# Patient Record
Sex: Female | Born: 1990 | Race: White | Hispanic: No | Marital: Single | Smoking: Never smoker
Health system: Southern US, Community
[De-identification: ages and names within clinical notes are randomized; demographics above are authoritative.]

## PROBLEM LIST (undated history)

## (undated) DIAGNOSIS — K219 Gastro-esophageal reflux disease without esophagitis: Secondary | ICD-10-CM

## (undated) DIAGNOSIS — F32A Depression, unspecified: Secondary | ICD-10-CM

## (undated) DIAGNOSIS — M5126 Other intervertebral disc displacement, lumbar region: Secondary | ICD-10-CM

## (undated) DIAGNOSIS — M1990 Unspecified osteoarthritis, unspecified site: Secondary | ICD-10-CM

## (undated) DIAGNOSIS — F419 Anxiety disorder, unspecified: Secondary | ICD-10-CM

## (undated) HISTORY — DX: Depression, unspecified: F32.A

## (undated) HISTORY — DX: Anxiety disorder, unspecified: F41.9

## (undated) HISTORY — DX: Unspecified osteoarthritis, unspecified site: M19.90

## (undated) HISTORY — DX: Gastro-esophageal reflux disease without esophagitis: K21.9

---

## 2017-08-15 ENCOUNTER — Other Ambulatory Visit: Payer: Self-pay

## 2017-08-15 ENCOUNTER — Emergency Department
Admission: EM | Admit: 2017-08-15 | Discharge: 2017-08-15 | Disposition: A | Discharge status: Home / Self Care | Payer: BLUE CROSS/BLUE SHIELD | Attending: Emergency Medicine | Admitting: Emergency Medicine

## 2017-08-15 ENCOUNTER — Encounter: Payer: Self-pay | Admitting: Emergency Medicine

## 2017-08-15 DIAGNOSIS — M5431 Sciatica, right side: Secondary | ICD-10-CM | POA: Insufficient documentation

## 2017-08-15 DIAGNOSIS — M545 Low back pain: Secondary | ICD-10-CM | POA: Diagnosis present

## 2017-08-15 HISTORY — DX: Other intervertebral disc displacement, lumbar region: M51.26

## 2017-08-15 MED ORDER — HYDROCODONE-ACETAMINOPHEN 5-325 MG PO TABS: 1.0000 | ORAL_TABLET | ORAL | 0 refills | Status: DC | PRN

## 2017-08-15 MED ORDER — CYCLOBENZAPRINE HCL 10 MG PO TABS
10.0000 mg | ORAL_TABLET | Freq: Once | ORAL | Status: AC
  Administered 2017-08-15: 10 mg via ORAL
  Filled 2017-08-15: qty 1

## 2017-08-15 MED ORDER — CYCLOBENZAPRINE HCL 10 MG PO TABS: 10.0000 mg | ORAL_TABLET | Freq: Three times a day (TID) | ORAL | 0 refills | Status: DC | PRN

## 2017-08-15 MED ORDER — PREDNISONE 10 MG (21) PO TBPK: ORAL_TABLET | ORAL | 0 refills | Status: DC

## 2017-08-15 NOTE — ED Provider Notes (Signed)
[facility] Emergency Department Provider Note ____________________________________________  Time seen: Approximately 10:35 AM  I have reviewed the triage vital signs and the nursing notes.  HISTORY  Chief Complaint Back Pain   HPI Megan Marsh is a 27 y.o. female who presents to the emergency department for treatment and evaluation of low back pain that started 2 days ago. She states she got on her knees to plug a cord in an outlet  Past Medical History:  Diagnosis Date  . Lumbar herniated disc     There are no active problems to display for this patient.   History reviewed. No pertinent surgical history.  Prior to Admission medications   Medication Sig Start Date End Date Taking? Authorizing Provider  cyclobenzaprine (FLEXERIL) 10 MG tablet Take 1 tablet (10 mg total) by mouth 3 (three) times daily as needed for muscle spasms. 08/15/17   Lilu Mcglown, Rulon Eisenmenger B, FNP  HYDROcodone-acetaminophen (NORCO/VICODIN) 5-325 MG tablet Take 1 tablet by mouth every 4 (four) hours as needed for moderate pain. 08/15/17 08/15/18  Dwayne Bulkley, Kasandra Knudsen, FNP  predniSONE (STERAPRED UNI-PAK 21 TAB) 10 MG (21) TBPK tablet Take 6 tablets on day 1 Take 5 tablets on day 2 Take 4 tablets on day 3 Take 3 tablets on day 4 Take 2 tablets on day 5 Take 1 tablet on day 6 08/15/17   Kem Boroughs B, FNP    Allergies Patient has no known allergies.  History reviewed. No pertinent family history.  Social History Social History   Tobacco Use  . Smoking status: Never Smoker  . Smokeless tobacco: Never Used  Substance Use Topics  . Alcohol use: No    Frequency: Never  . Drug use: No    Review of Systems Constitutional: Well appearing. Respiratory: Negative for dyspnea. Cardiovascular: Negative for change in skin temperature or color. Musculoskeletal:   Negative for chronic steroid use   Negative for trauma in the presence of osteoporosis  Negative for age over 43 and  trauma.  Negative for constitutional symptoms, or history of cancer.  Negative for pain worse at night. Skin:   Negative for rash, lesion, or wound.  Genitourinary: Negative for urinary retention. Rectal: Negative for fecal incontinence or new onset constipation/bowel habit changes. Hematological/Immunilogical: Negative for immunosuppression, IV drug use, or fever Neurological: Positive for burning, tingling, numb, electric, radiating pain in the right lower back with radiation into the posterior leg to knee.                        Negative for saddle anesthesia.                        Negative for focal neurologic deficit, progressive or disabling symptoms             Negative for saddle anesthesia. ____________________________________________   PHYSICAL EXAM:  VITAL SIGNS: ED Triage Vitals  Enc Vitals Group     BP 08/15/17 0751 (!) 160/80     Pulse Rate 08/15/17 0750 94     Resp 08/15/17 0750 18     Temp 08/15/17 0750 97.7 F (36.5 C)     Temp Source 08/15/17 0750 Oral     SpO2 08/15/17 0750 99 %     Weight 08/15/17 0750 290 lb (131.5 kg)     Height 08/15/17 0750 5\' 5"  (1.651 m)     Head Circumference --      Peak Flow --  Pain Score 08/15/17 0750 7     Pain Loc --      Pain Edu? --      Excl. in GC? --     Constitutional: Alert and oriented. Well appearing and in no acute distress. Eyes: Conjunctivae are clear without discharge or drainage.  Head: Atraumatic. Neck: Full, active range of motion. Respiratory: Respirations even and unlabored. Musculoskeletal: Decreased ROM of the back and extremities, Strength 5/5 of the lower extremities as tested. Neurologic: Reflexes of the lower extremities are 2+. Positive straight leg raise on the right side. Skin: Atraumatic.  Psychiatric: Behavior and affect are normal.  ____________________________________________   LABS (all labs ordered are listed, but only abnormal results are displayed)  Labs Reviewed - No data to  display ____________________________________________  RADIOLOGY  Not indicated. ____________________________________________   PROCEDURES  Procedure(s) performed:  Procedures ____________________________________________   INITIAL IMPRESSION / ASSESSMENT AND PLAN / ED COURSE  Megan SimmondsCarolyn Mallinger is a 27 y.o. female Who presents to the emergency department for evaluation and treatment of low back pain that radiates into the right lower extremity that started after she bent over and plugged an electric cord into the wall 2 days ago.  Symptoms and exam are most consistent with sciatica.  She will be given prescriptions for prednisone, Flexeril, and Norco.  She was instructed to follow-up with the primary care provider for choice for symptoms that are not improving over the next few days.  She was also instructed to call and schedule follow-up appointment with orthopedics if possible.  She was instructed to return to the emergency department for symptoms of change or worsen if she is unable to schedule an appointment.  Medications  cyclobenzaprine (FLEXERIL) tablet 10 mg (10 mg Oral Given 08/15/17 0827)    ED Discharge Orders        Ordered    cyclobenzaprine (FLEXERIL) 10 MG tablet  3 times daily PRN     08/15/17 0831    predniSONE (STERAPRED UNI-PAK 21 TAB) 10 MG (21) TBPK tablet     08/15/17 0831    HYDROcodone-acetaminophen (NORCO/VICODIN) 5-325 MG tablet  Every 4 hours PRN     08/15/17 0831       Pertinent labs & imaging results that were available during my care of the patient were reviewed by me and considered in my medical decision making (see chart for details).  _________________________________________   FINAL CLINICAL IMPRESSION(S) / ED DIAGNOSES  Final diagnoses:  Sciatica of right side     If controlled substance prescribed during this visit, 12 month history viewed on the [place] prior to issuing an initial prescription for Schedule II or III opiod.   Chinita Pesterriplett,  Medrith Veillon B, FNP 08/15/17 1231  Governor RooksLord, Rebecca, MD 08/15/17 [phone]

## 2017-08-15 NOTE — ED Triage Notes (Signed)
Here for low back pain that started 2 days ago. Felt pain when was bending down to put a plug in wall and stood up.  Reports herniated disc to lumbar.  ambulatory without difficulty. NAD

## 2017-08-29 ENCOUNTER — Other Ambulatory Visit: Payer: Self-pay

## 2017-08-29 ENCOUNTER — Emergency Department
Admission: EM | Admit: 2017-08-29 | Discharge: 2017-08-29 | Disposition: A | Discharge status: Home / Self Care | Payer: BLUE CROSS/BLUE SHIELD | Attending: Emergency Medicine | Admitting: Emergency Medicine

## 2017-08-29 DIAGNOSIS — R05 Cough: Secondary | ICD-10-CM | POA: Diagnosis present

## 2017-08-29 DIAGNOSIS — B9789 Other viral agents as the cause of diseases classified elsewhere: Secondary | ICD-10-CM | POA: Insufficient documentation

## 2017-08-29 DIAGNOSIS — J069 Acute upper respiratory infection, unspecified: Secondary | ICD-10-CM

## 2017-08-29 MED ORDER — OXYMETAZOLINE HCL 0.05 % NA SOLN: 2.0000 | Freq: Two times a day (BID) | NASAL | 2 refills | Status: AC

## 2017-08-29 MED ORDER — GUAIFENESIN-CODEINE 100-10 MG/5ML PO SYRP: 5.0000 mL | ORAL_SOLUTION | Freq: Three times a day (TID) | ORAL | 0 refills | Status: AC | PRN

## 2017-08-29 MED ORDER — LIDOCAINE VISCOUS 2 % MT SOLN: 10.0000 mL | OROMUCOSAL | 0 refills | Status: DC | PRN

## 2017-08-29 MED ORDER — FLUTICASONE PROPIONATE 50 MCG/ACT NA SUSP: 2.0000 | Freq: Every day | NASAL | 0 refills | Status: DC

## 2017-08-29 NOTE — ED Provider Notes (Signed)
[facility] Emergency Department Provider Note  ____________________________________________  Time seen: Approximately 4:12 PM  I have reviewed the triage vital signs and the nursing notes.   HISTORY  Chief Complaint Otalgia; Cough; and Facial Pain    HPI Megan Marsh is a 27 y.o. female that presents emergency department for evaluation of ear pain nonproductive cough for 2 days., nasal congestion, nonproductive cough for 2 days.  No sick contacts.  No fever, chills, nausea, vomiting, abdominal pain, diarrhea, constipation.  Past Medical History:  Diagnosis Date  . Lumbar herniated disc     There are no active problems to display for this patient.   History reviewed. No pertinent surgical history.  Prior to Admission medications   Medication Sig Start Date End Date Taking? Authorizing Provider  cyclobenzaprine (FLEXERIL) 10 MG tablet Take 1 tablet (10 mg total) by mouth 3 (three) times daily as needed for muscle spasms. 08/15/17   Triplett, Cari B, FNP  fluticasone (FLONASE) 50 MCG/ACT nasal spray Place 2 sprays into both nostrils daily. 08/29/17 08/29/18  Enid DerryWagner, Siri Buege, PA-C  guaiFENesin-codeine (ROBITUSSIN AC) 100-10 MG/5ML syrup Take 5 mLs by mouth 3 (three) times daily as needed for up to 2 days for cough. 08/29/17 08/31/17  Enid DerryWagner, Avalynne Diver, PA-C  HYDROcodone-acetaminophen (NORCO/VICODIN) 5-325 MG tablet Take 1 tablet by mouth every 4 (four) hours as needed for moderate pain. 08/15/17 08/15/18  Triplett, Rulon Eisenmengerari B, FNP  lidocaine (XYLOCAINE) 2 % solution Use as directed 10 mLs in the mouth or throat as needed for mouth pain. 08/29/17   Enid DerryWagner, Baraka Klatt, PA-C  oxymetazoline (AFRIN) 0.05 % nasal spray Place 2 sprays into both nostrils 2 (two) times daily for 3 days. 08/29/17 09/01/17  Enid DerryWagner, Trenda Corliss, PA-C  predniSONE (STERAPRED UNI-PAK 21 TAB) 10 MG (21) TBPK tablet Take 6 tablets on day 1 Take 5 tablets on day 2 Take 4 tablets on day 3 Take 3 tablets on day  4 Take 2 tablets on day 5 Take 1 tablet on day 6 08/15/17   Kem Boroughsriplett, Cari B, FNP    Allergies Patient has no known allergies.  No family history on file.  Social History Social History   Tobacco Use  . Smoking status: Never Smoker  . Smokeless tobacco: Never Used  Substance Use Topics  . Alcohol use: No    Frequency: Never  . Drug use: No     Review of Systems  Constitutional: No fever/chills Eyes: No visual changes. No discharge. ENT: Positive for congestion and rhinorrhea. Cardiovascular: No chest pain. Respiratory: Positive for cough. No SOB. Gastrointestinal: No abdominal pain.  No nausea, no vomiting.  No diarrhea.  No constipation. Musculoskeletal: Negative for musculoskeletal pain. Skin: Negative for rash, abrasions, lacerations, ecchymosis. Neurological: Negative for headaches.   ____________________________________________   PHYSICAL EXAM:  VITAL SIGNS: ED Triage Vitals [08/29/17 1455]  Enc Vitals Group     BP (!) 152/107     Pulse Rate (!) 112     Resp 16     Temp 97.7 F (36.5 C)     Temp Source Oral     SpO2 96 %     Weight 290 lb (131.5 kg)     Height 5\' 5"  (1.651 m)     Head Circumference      Peak Flow      Pain Score 6     Pain Loc      Pain Edu?      Excl. in GC?      Constitutional: Alert  and oriented. Well appearing and in no acute distress. Eyes: Conjunctivae are normal. PERRL. EOMI. No discharge. Head: Atraumatic. ENT: No frontal and maxillary sinus tenderness.      Ears: Tympanic membranes pearly gray with good landmarks. No discharge.      Nose: Mild congestion/rhinnorhea.      Mouth/Throat: Mucous membranes are moist. Oropharynx non-erythematous. Tonsils not enlarged. No exudates. Uvula midline. Neck: No stridor.   Hematological/Lymphatic/Immunilogical: No cervical lymphadenopathy. Cardiovascular: Normal rate, regular rhythm.  Good peripheral circulation. Respiratory: Normal respiratory effort without tachypnea or  retractions. Lungs CTAB. Good air entry to the bases with no decreased or absent breath sounds. Gastrointestinal: Bowel sounds 4 quadrants. Soft and nontender to palpation. No guarding or rigidity. No palpable masses. No distention. Musculoskeletal: Full range of motion to all extremities. No gross deformities appreciated. Neurologic:  Normal speech and language. No gross focal neurologic deficits are appreciated.  Skin:  Skin is warm, dry and intact. No rash noted. Psychiatric: Mood and affect are normal. Speech and behavior are normal. Patient exhibits appropriate insight and judgement.   ____________________________________________   LABS (all labs ordered are listed, but only abnormal results are displayed)  Labs Reviewed - No data to display ____________________________________________  EKG   ____________________________________________  RADIOLOGY   No results found.  ____________________________________________    PROCEDURES  Procedure(s) performed:    Procedures    Medications - No data to display   ____________________________________________   INITIAL IMPRESSION / ASSESSMENT AND PLAN / ED COURSE  Pertinent labs & imaging results that were available during my care of the patient were reviewed by me and considered in my medical decision making (see chart for details).  Review of the [place] CSRS was performed in accordance of the [facility] prior to dispensing any controlled drugs.     Patient's diagnosis is consistent with viral URI. Vital signs and exam are reassuring. Patient appears well and is staying well hydrated. Patient should alternate tylenol and ibuprofen for fever. Patient feels comfortable going home. Patient will be discharged home with prescriptions for Flonase, Afrin, Robitussin, viscous lidocaine. Patient is to follow up with PCP as needed or otherwise directed. Patient is given ED precautions to return to the ED for any worsening or new  symptoms.     ____________________________________________  FINAL CLINICAL IMPRESSION(S) / ED DIAGNOSES  Final diagnoses:  Viral URI with cough      NEW MEDICATIONS STARTED DURING THIS VISIT:  ED Discharge Orders        Ordered    fluticasone (FLONASE) 50 MCG/ACT nasal spray  Daily     08/29/17 1648    oxymetazoline (AFRIN) 0.05 % nasal spray  2 times daily     08/29/17 1648    guaiFENesin-codeine (ROBITUSSIN AC) 100-10 MG/5ML syrup  3 times daily PRN     08/29/17 1648    lidocaine (XYLOCAINE) 2 % solution  As needed     08/29/17 1648          This chart was dictated using voice recognition software/Dragon. Despite best efforts to proofread, errors can occur which can change the meaning. Any change was purely unintentional.    Enid Derry, PA-C 08/29/17 2249    Don Perking, [place], MD 09/02/17 1539

## 2017-08-29 NOTE — ED Triage Notes (Signed)
Cough, nasal congestion, ear pain X 2 days. Pt alert and oriented X4, active, cooperative, pt in NAD. RR even and unlabored, color WNL.

## 2018-06-24 ENCOUNTER — Encounter: Payer: Self-pay | Admitting: Emergency Medicine

## 2018-06-24 ENCOUNTER — Other Ambulatory Visit: Payer: Self-pay

## 2018-06-24 ENCOUNTER — Emergency Department
Admission: EM | Admit: 2018-06-24 | Discharge: 2018-06-24 | Disposition: A | Discharge status: Home / Self Care | Payer: BLUE CROSS/BLUE SHIELD | Attending: Emergency Medicine | Admitting: Emergency Medicine

## 2018-06-24 DIAGNOSIS — R51 Headache: Secondary | ICD-10-CM

## 2018-06-24 DIAGNOSIS — Z79899 Other long term (current) drug therapy: Secondary | ICD-10-CM | POA: Insufficient documentation

## 2018-06-24 DIAGNOSIS — R519 Headache, unspecified: Secondary | ICD-10-CM

## 2018-06-24 DIAGNOSIS — J0100 Acute maxillary sinusitis, unspecified: Secondary | ICD-10-CM | POA: Insufficient documentation

## 2018-06-24 DIAGNOSIS — R030 Elevated blood-pressure reading, without diagnosis of hypertension: Secondary | ICD-10-CM

## 2018-06-24 LAB — INFLUENZA PANEL BY PCR (TYPE A & B)
Influenza A By PCR: NEGATIVE
Influenza B By PCR: NEGATIVE

## 2018-06-24 MED ORDER — AMOXICILLIN-POT CLAVULANATE 875-125 MG PO TABS: 1.0000 | ORAL_TABLET | Freq: Two times a day (BID) | ORAL | 0 refills | Status: AC

## 2018-06-24 MED ORDER — ACETAMINOPHEN 500 MG PO TABS: 500.0000 mg | ORAL_TABLET | Freq: Four times a day (QID) | ORAL | 0 refills | Status: DC | PRN

## 2018-06-24 NOTE — ED Provider Notes (Signed)
[facility] Emergency Department Provider Note  ____________________________________________  Time seen: Approximately 10:42 AM  I have reviewed the triage vital signs and the nursing notes.   HISTORY  Chief Complaint Facial Pain    HPI Megan Marsh is a 28 y.o. female presents emergency department for evaluation of cheek pain and ear pain and pressure for 3 days.  She states that yesterday the pain was making the roof of her mouth hurt.  Patient states that she had a URI over Christmas and has been on and off since then.  She has some nasal congestion but not a significant amount until she got to the emergency department.  Her nose has been running since in the emergency department.  She had a sinus infection last year.  No dental pain.  This does not feel like a headache.  She does not have a history of high blood pressure but states that she is planning a wedding and has been more stressed than usual recently.  No headache, shortness of breath, chest pain.   Past Medical History:  Diagnosis Date  . Lumbar herniated disc     There are no active problems to display for this patient.   No past surgical history on file.  Prior to Admission medications   Medication Sig Start Date End Date Taking? Authorizing Provider  acetaminophen (TYLENOL) 500 MG tablet Take 1 tablet (500 mg total) by mouth every 6 (six) hours as needed. 06/24/18   Enid Derry, PA-C  amoxicillin-clavulanate (AUGMENTIN) 875-125 MG tablet Take 1 tablet by mouth 2 (two) times daily for 10 days. 06/24/18 07/04/18  Enid Derry, PA-C  cyclobenzaprine (FLEXERIL) 10 MG tablet Take 1 tablet (10 mg total) by mouth 3 (three) times daily as needed for muscle spasms. 08/15/17   Triplett, Cari B, FNP  fluticasone (FLONASE) 50 MCG/ACT nasal spray Place 2 sprays into both nostrils daily. 08/29/17 08/29/18  Enid Derry, PA-C  HYDROcodone-acetaminophen (NORCO/VICODIN) 5-325 MG tablet Take 1 tablet by  mouth every 4 (four) hours as needed for moderate pain. 08/15/17 08/15/18  Triplett, Rulon Eisenmenger B, FNP  lidocaine (XYLOCAINE) 2 % solution Use as directed 10 mLs in the mouth or throat as needed for mouth pain. 08/29/17   Enid Derry, PA-C  predniSONE (STERAPRED UNI-PAK 21 TAB) 10 MG (21) TBPK tablet Take 6 tablets on day 1 Take 5 tablets on day 2 Take 4 tablets on day 3 Take 3 tablets on day 4 Take 2 tablets on day 5 Take 1 tablet on day 6 08/15/17   Kem Boroughs B, FNP    Allergies Patient has no known allergies.  No family history on file.  Social History Social History   Tobacco Use  . Smoking status: Never Smoker  . Smokeless tobacco: Never Used  Substance Use Topics  . Alcohol use: No    Frequency: Never  . Drug use: No     Review of Systems  Constitutional: No fever/chills Eyes: No visual changes. No discharge. ENT: Positive for congestion and rhinorrhea. Cardiovascular: No chest pain. Respiratory: Positive for cough. No SOB. Gastrointestinal: No abdominal pain.  No nausea, no vomiting.   Musculoskeletal: Negative for musculoskeletal pain. Skin: Negative for rash, abrasions, lacerations, ecchymosis. Neurological: Negative for headaches.   ____________________________________________   PHYSICAL EXAM:  VITAL SIGNS: ED Triage Vitals  Enc Vitals Group     BP 06/24/18 1013 (!) 174/98     Pulse Rate 06/24/18 1013 98     Resp 06/24/18 1013 16  Temp 06/24/18 1013 98.1 F (36.7 C)     Temp Source 06/24/18 1013 Oral     SpO2 06/24/18 1013 99 %     Weight 06/24/18 1012 280 lb (127 kg)     Height 06/24/18 1012 5\' 5"  (1.651 m)     Head Circumference --      Peak Flow --      Pain Score 06/24/18 1012 0     Pain Loc --      Pain Edu? --      Excl. in GC? --      Constitutional: Alert and oriented. Well appearing and in no acute distress. Eyes: Conjunctivae are normal. PERRL. EOMI. No discharge. Head: Atraumatic. ENT: Maxillary sinus tenderness.      Ears:  Tympanic membranes pearly gray with good landmarks. No discharge.      Nose: Mild congestion/rhinnorhea.      Mouth/Throat: Mucous membranes are moist. Oropharynx non-erythematous. Tonsils not enlarged. No exudates. Uvula midline. Neck: No stridor.   Hematological/Lymphatic/Immunilogical: No cervical lymphadenopathy. Cardiovascular: Normal rate, regular rhythm.  Good peripheral circulation. Respiratory: Normal respiratory effort without tachypnea or retractions. Lungs CTAB. Good air entry to the bases with no decreased or absent breath sounds. Gastrointestinal: Bowel sounds 4 quadrants. Soft and nontender to palpation. No guarding or rigidity. No palpable masses. No distention. Musculoskeletal: Full range of motion to all extremities. No gross deformities appreciated. Neurologic:  Normal speech and language. No gross focal neurologic deficits are appreciated.  Skin:  Skin is warm, dry and intact. No rash noted. Psychiatric: Mood and affect are normal. Speech and behavior are normal. Patient exhibits appropriate insight and judgement.   ____________________________________________   LABS (all labs ordered are listed, but only abnormal results are displayed)  Labs Reviewed  INFLUENZA PANEL BY PCR (TYPE A & B)   ____________________________________________  EKG   ____________________________________________  RADIOLOGY  No results found.  ____________________________________________    PROCEDURES  Procedure(s) performed:    Procedures    Medications - No data to display   ____________________________________________   INITIAL IMPRESSION / ASSESSMENT AND PLAN / ED COURSE  Pertinent labs & imaging results that were available during my care of the patient were reviewed by me and considered in my medical decision making (see chart for details).  Review of the [place] CSRS was performed in accordance of the [facility] prior to dispensing any controlled drugs.     Patient's  diagnosis is consistent with sinusitis Vital signs and exam are reassuring.  Patient's blood pressure has been elevated similarly in previous visits dating back to 2013.  Patient was highly encouraged to follow-up with primary care for hypertension work-up and medication.  Patient is agreeable to follow-up with primary care. Patient feels comfortable going home. Patient will be discharged home with prescriptions for Augmentin.  Patient is to follow up with PCP as needed or otherwise directed. Patient is given ED precautions to return to the ED for any worsening or new symptoms.     ____________________________________________  FINAL CLINICAL IMPRESSION(S) / ED DIAGNOSES  Final diagnoses:  Elevated blood pressure reading  Facial pain  Acute non-recurrent maxillary sinusitis      NEW MEDICATIONS STARTED DURING THIS VISIT:  ED Discharge Orders         Ordered    amoxicillin-clavulanate (AUGMENTIN) 875-125 MG tablet  2 times daily     06/24/18 1159    acetaminophen (TYLENOL) 500 MG tablet  Every 6 hours PRN     06/24/18 1159  This chart was dictated using voice recognition software/Dragon. Despite best efforts to proofread, errors can occur which can change the meaning. Any change was purely unintentional.    Enid DerryWagner, Rexanne Inocencio, PA-C 06/24/18 1820    Emily FilbertWilliams, Jonathan E, MD 06/25/18 [phone]

## 2018-06-24 NOTE — ED Triage Notes (Signed)
Pt c/o sinus congestion and pain intermittently for the past month. States " I think I have a sinus infection"

## 2018-06-24 NOTE — ED Notes (Signed)
See triage note  Presents with a 1 month hx of sinus pressure  States sxs' are worse over the past 2-3 days  Denies any fever

## 2018-06-24 NOTE — Discharge Instructions (Signed)
You were given an antibiotic for a sinus infection.  Your flu test was negative.  Your blood pressure was elevated in the emergency department, which can be affected by stress and pain.  Please call primary care this week for blood pressure recheck and discussion of hypertension.  High blood pressure puts you at increased risk for a stroke or heart attack.  Return to the emergency department immediately for headache, shortness of breath, chest pain.

## 2018-07-10 ENCOUNTER — Emergency Department
Admission: EM | Admit: 2018-07-10 | Discharge: 2018-07-10 | Disposition: A | Discharge status: Home / Self Care | Payer: BLUE CROSS/BLUE SHIELD | Attending: Emergency Medicine | Admitting: Emergency Medicine

## 2018-07-10 ENCOUNTER — Other Ambulatory Visit: Payer: Self-pay

## 2018-07-10 DIAGNOSIS — B9789 Other viral agents as the cause of diseases classified elsewhere: Secondary | ICD-10-CM | POA: Insufficient documentation

## 2018-07-10 DIAGNOSIS — R05 Cough: Secondary | ICD-10-CM | POA: Diagnosis present

## 2018-07-10 DIAGNOSIS — J069 Acute upper respiratory infection, unspecified: Secondary | ICD-10-CM | POA: Diagnosis not present

## 2018-07-10 DIAGNOSIS — Z79899 Other long term (current) drug therapy: Secondary | ICD-10-CM | POA: Insufficient documentation

## 2018-07-10 LAB — GROUP A STREP BY PCR: Group A Strep by PCR: NOT DETECTED

## 2018-07-10 MED ORDER — ALBUTEROL SULFATE HFA 108 (90 BASE) MCG/ACT IN AERS: 2.0000 | INHALATION_SPRAY | Freq: Four times a day (QID) | RESPIRATORY_TRACT | 0 refills | Status: DC | PRN

## 2018-07-10 MED ORDER — IPRATROPIUM-ALBUTEROL 0.5-2.5 (3) MG/3ML IN SOLN
3.0000 mL | Freq: Once | RESPIRATORY_TRACT | Status: AC
  Administered 2018-07-10: 3 mL via RESPIRATORY_TRACT
  Filled 2018-07-10: qty 3

## 2018-07-10 MED ORDER — BENZONATATE 100 MG PO CAPS: ORAL_CAPSULE | ORAL | 0 refills | Status: DC

## 2018-07-10 MED ORDER — AZITHROMYCIN 250 MG PO TABS: ORAL_TABLET | ORAL | 0 refills | Status: DC

## 2018-07-10 NOTE — ED Triage Notes (Addendum)
Pt comes via POV from home with c/o congestion. Pt states cough nonproductive and dry. Pt also c/o sore throat.  Pt states she was recently prescribed amoxicillin. Pt states she took it all and is still not better.

## 2018-07-10 NOTE — ED Notes (Signed)
Reference triage note. Pt in NAD at this time.  

## 2018-07-10 NOTE — Discharge Instructions (Signed)
Your strep test was negative. Your symptoms are consistent with a likely viral cause. Take the prescription meds as directed. Take OTC Delsym for additional cough relief. Take Tylenol for headaches and fevers. You may consider a daily allergy medicine and steroid nasal spray.

## 2018-07-10 NOTE — ED Provider Notes (Signed)
[facility] Emergency Department Provider Note ____________________________________________  Time seen: 2010  I have reviewed the triage vital signs and the nursing notes.  HISTORY  Chief Complaint  Sore Throat and Cough  HPI Quincy SimmondsCarolyn Dever is a 28 y.o. female presents herself to the ED for evaluation of cough and congestion.  Patient describes cough is nonproductive and dry.  She also notes some sore throat symptoms at this time.  She is about 5 to 6 days out from a 10-day course of amoxicillin, prescribed for presumed sinusitis.  According to the patient, she did not significantly feel improved after the amoxicillin, she presents now noting the symptoms feel different.  She denies any frank fevers but has noted some chills.  She also denies any nausea vomiting, but does experience some loose stools.  She denies any sick contacts, recent travel, or other exposures.  The patient did not receive the seasonal flu vaccine.  Past Medical History:  Diagnosis Date  . Lumbar herniated disc     There are no active problems to display for this patient.   History reviewed. No pertinent surgical history.  Prior to Admission medications   Medication Sig Start Date End Date Taking? Authorizing Provider  acetaminophen (TYLENOL) 500 MG tablet Take 1 tablet (500 mg total) by mouth every 6 (six) hours as needed. 06/24/18   Enid DerryWagner, Ashley, PA-C  albuterol (PROVENTIL HFA;VENTOLIN HFA) 108 (90 Base) MCG/ACT inhaler Inhale 2 puffs into the lungs every 6 (six) hours as needed for wheezing or shortness of breath. 07/10/18   Jvion Turgeon, Charlesetta IvoryJenise V Bacon, PA-C  azithromycin (ZITHROMAX Z-PAK) 250 MG tablet Take 2 tablets (500 mg) on  Day 1,  followed by 1 tablet (250 mg) once daily on Days 2 through 5. 07/10/18   Anali Cabanilla, Charlesetta IvoryJenise V Bacon, PA-C  benzonatate (TESSALON PERLES) 100 MG capsule Take 1-2 tabs TID prn cough 07/10/18   Josefina Rynders, Charlesetta IvoryJenise V Bacon, PA-C  cyclobenzaprine (FLEXERIL) 10 MG tablet Take  1 tablet (10 mg total) by mouth 3 (three) times daily as needed for muscle spasms. 08/15/17   Triplett, Cari B, FNP  fluticasone (FLONASE) 50 MCG/ACT nasal spray Place 2 sprays into both nostrils daily. 08/29/17 08/29/18  Enid DerryWagner, Ashley, PA-C  HYDROcodone-acetaminophen (NORCO/VICODIN) 5-325 MG tablet Take 1 tablet by mouth every 4 (four) hours as needed for moderate pain. 08/15/17 08/15/18  Triplett, Rulon Eisenmengerari B, FNP  lidocaine (XYLOCAINE) 2 % solution Use as directed 10 mLs in the mouth or throat as needed for mouth pain. 08/29/17   Enid DerryWagner, Ashley, PA-C  predniSONE (STERAPRED UNI-PAK 21 TAB) 10 MG (21) TBPK tablet Take 6 tablets on day 1 Take 5 tablets on day 2 Take 4 tablets on day 3 Take 3 tablets on day 4 Take 2 tablets on day 5 Take 1 tablet on day 6 08/15/17   Kem Boroughsriplett, Cari B, FNP    Allergies Patient has no known allergies.  No family history on file.  Social History Social History   Tobacco Use  . Smoking status: Never Smoker  . Smokeless tobacco: Never Used  Substance Use Topics  . Alcohol use: No    Frequency: Never  . Drug use: No    Review of Systems  Constitutional: Negative for fever. Eyes: Negative for visual changes. ENT: Positive for sore throat. Cardiovascular: Negative for chest pain. Respiratory: Negative for shortness of breath.  Reports dry cough. Gastrointestinal: Negative for abdominal pain, vomiting and diarrhea. Genitourinary: Negative for dysuria. Musculoskeletal: Negative for back pain. Skin: Negative  for rash. Neurological: Negative for headaches, focal weakness or numbness. ____________________________________________  PHYSICAL EXAM:  VITAL SIGNS: ED Triage Vitals  Enc Vitals Group     BP 07/10/18 1818 (!) 149/113     Pulse Rate 07/10/18 1818 93     Resp 07/10/18 1818 18     Temp 07/10/18 1818 98.2 F (36.8 C)     Temp Source 07/10/18 1818 Oral     SpO2 07/10/18 1818 99 %     Weight 07/10/18 1819 280 lb (127 kg)     Height 07/10/18 1819 5\' 5"   (1.651 m)     Head Circumference --      Peak Flow --      Pain Score 07/10/18 1819 4     Pain Loc --      Pain Edu? --      Excl. in GC? --     Constitutional: Alert and oriented. Well appearing and in no distress. Head: Normocephalic and atraumatic. Eyes: Conjunctivae are normal. PERRL. Normal extraocular movements Ears: Canals clear. TMs intact bilaterally. Nose: No congestion/rhinorrhea/epistaxis. Mouth/Throat: Mucous membranes are moist. Uvula is midline and tonsils are flat and without exudate. Oropharyngeal erythema noted.  Neck: Supple. No thyromegaly. Hematological/Lymphatic/Immunological: No cervical lymphadenopathy. Cardiovascular: Normal rate, regular rhythm. Normal distal pulses. Respiratory: Normal respiratory effort. No wheezes/rales.  Mild rhonchi noted bilaterally ____________________________________________   LABS (pertinent positives/negatives) Labs Reviewed  GROUP A STREP BY PCR  ____________________________________________  PROCEDURES  Procedures DuoNeb x 1 ____________________________________________  INITIAL IMPRESSION / ASSESSMENT AND PLAN / ED COURSE  Patient with ED evaluation of cough and sore throat. Her strep PCR is negative.  She will be treated empirically for bronchitis with prescriptions for albuterol, azithromycin, Tessalon Perles.  She will follow-up with her provider as needed.  ____________________________________________  FINAL CLINICAL IMPRESSION(S) / ED DIAGNOSES  Final diagnoses:  Viral URI with cough      Camdin Hegner, Charlesetta Ivory, PA-C 07/11/18 0000    Arnaldo Natal, MD 07/16/18 [phone]

## 2018-08-02 ENCOUNTER — Emergency Department: Payer: BLUE CROSS/BLUE SHIELD

## 2018-08-02 ENCOUNTER — Encounter: Payer: Self-pay | Admitting: Emergency Medicine

## 2018-08-02 ENCOUNTER — Emergency Department
Admission: EM | Admit: 2018-08-02 | Discharge: 2018-08-02 | Disposition: A | Discharge status: Home / Self Care | Payer: BLUE CROSS/BLUE SHIELD | Attending: Emergency Medicine | Admitting: Emergency Medicine

## 2018-08-02 ENCOUNTER — Other Ambulatory Visit: Payer: Self-pay

## 2018-08-02 DIAGNOSIS — Y999 Unspecified external cause status: Secondary | ICD-10-CM | POA: Insufficient documentation

## 2018-08-02 DIAGNOSIS — Y92012 Bathroom of single-family (private) house as the place of occurrence of the external cause: Secondary | ICD-10-CM | POA: Insufficient documentation

## 2018-08-02 DIAGNOSIS — M542 Cervicalgia: Secondary | ICD-10-CM | POA: Insufficient documentation

## 2018-08-02 DIAGNOSIS — S0003XA Contusion of scalp, initial encounter: Secondary | ICD-10-CM | POA: Insufficient documentation

## 2018-08-02 DIAGNOSIS — W01198A Fall on same level from slipping, tripping and stumbling with subsequent striking against other object, initial encounter: Secondary | ICD-10-CM | POA: Insufficient documentation

## 2018-08-02 DIAGNOSIS — Y9389 Activity, other specified: Secondary | ICD-10-CM | POA: Insufficient documentation

## 2018-08-02 DIAGNOSIS — S0990XA Unspecified injury of head, initial encounter: Secondary | ICD-10-CM | POA: Diagnosis present

## 2018-08-02 LAB — POCT PREGNANCY, URINE: PREG TEST UR: NEGATIVE

## 2018-08-02 MED ORDER — TRAMADOL HCL 50 MG PO TABS
50.0000 mg | ORAL_TABLET | Freq: Once | ORAL | Status: AC
  Administered 2018-08-02: 50 mg via ORAL
  Filled 2018-08-02: qty 1

## 2018-08-02 NOTE — ED Provider Notes (Signed)
[facility] Emergency Department Provider Note ____________________________________________  Time seen: 1837  I have reviewed the triage vital signs and the nursing notes.  HISTORY  Chief Complaint  Head Injury   HPI Megan Marsh is a 28 y.o. female presents to the ER today with complaint of head injury.  She reports she woke up at 2 AM to use the bathroom.  While in the bathroom she started vomiting.  She somehow hit her forehead on the toilet.  She describes the headache as throbbing.  The pain radiates into her neck.  She describes neck pain as achy.  She denies dizziness, visual changes or confusion.  She has not had any further vomiting.  She reports bruising and swelling to her forehead.  She has applied ice but has not taken any medication over-the-counter.  She is not on any blood thinners.  Past Medical History:  Diagnosis Date  . Lumbar herniated disc     There are no active problems to display for this patient.   History reviewed. No pertinent surgical history.  Prior to Admission medications   Medication Sig Start Date End Date Taking? Authorizing Provider  acetaminophen (TYLENOL) 500 MG tablet Take 1 tablet (500 mg total) by mouth every 6 (six) hours as needed. 06/24/18   Enid Derry, PA-C  albuterol (PROVENTIL HFA;VENTOLIN HFA) 108 (90 Base) MCG/ACT inhaler Inhale 2 puffs into the lungs every 6 (six) hours as needed for wheezing or shortness of breath. 07/10/18   Menshew, Charlesetta Ivory, PA-C    Allergies Patient has no known allergies.  No family history on file.  Social History Social History   Tobacco Use  . Smoking status: Never Smoker  . Smokeless tobacco: Never Used  Substance Use Topics  . Alcohol use: No    Frequency: Never  . Drug use: No    Review of Systems  Constitutional: Negative for fever. Eyes: Negative for visual changes. ENT: Negative for drainage from the ears or throat.. Cardiovascular: Negative for chest  pain or chest tightness.Marland Kitchen Respiratory: Negative for shortness of breath. Gastrointestinal: Positive for 1 episode of vomiting.  Negative for abdominal pain, and diarrhea. Musculoskeletal: Positive for neck pain.  Negative for back pain. Skin: Positive for bruising and swelling to forehead.. Neurological: Positive for headache.  Negative for dizziness, focal weakness or numbness. ____________________________________________  PHYSICAL EXAM:  VITAL SIGNS: ED Triage Vitals [08/02/18 1824]  Enc Vitals Group     BP      Pulse      Resp      Temp      Temp src      SpO2      Weight 279 lb 15.8 oz (127 kg)     Height 5\' 5"  (1.651 m)     Head Circumference      Peak Flow      Pain Score 8     Pain Loc      Pain Edu?      Excl. in GC?     Constitutional: Alert and oriented. Well appearing and in no distress. Head: Normocephalic, traumatic hematoma noted to the central forehead. Eyes: Conjunctivae are normal. PERRL. Normal extraocular movements Ears: Canals clear. TMs intact bilaterally.  No drainage noted Nose: No congestion/rhinorrhea/epistaxis.  No drainage noted Cardiovascular: Normal rate, regular rhythm.  Respiratory: Normal respiratory effort. No wheezes/rales/rhonchi. Musculoskeletal: Normal flexion and rotation of the cervical spine.  Pain with extension of the cervical spine.  Bony tenderness noted over the cervical spine.  Neurologic:  Normal gait without ataxia. Normal speech and language. No gross focal neurologic deficits are appreciated. Skin:  Skin is warm, dry and intact.  Traumatic hematoma noted to the central forehead.  ____________________________________________     RADIOLOGY  Imaging Orders     CT Head Wo Contrast     CT Cervical Spine Wo Contrast IMPRESSION:  1. No evidence of acute intracranial abnormality.  2. Mild frontal scalp swelling.  3. No acute cervical spine fracture identified with suboptimal  assessment from C6-T1.  4. Mild upper cervical  lymphadenopathy, nonspecific but potentially  reactive. Clinical follow-up recommended with consideration for  follow-up neck CT if lymphadenopathy persists.    ____________________________________________    INITIAL IMPRESSION / ASSESSMENT AND PLAN / ED COURSE  Acute Headache, Acute Neck Pain s/p Head Injury:  CT Head with mild frontal scalp swelling CT Cervical Spine negative for acute fracture Tramadol 50 mg PO x 1 given in ER Advised ice for 20 minutes 2 x day Advised Tylenol 1000 mg every 8 hours as needed for pain     I reviewed the patient's prescription history over the last 12 months in the multi-state controlled substances database(s) that includes [place], [place], [place], [place], [place], [place], [place], [place], [place], [place], [place], [place], [place], and [place].  Results were notable for no controlled substance in the last 6 months. ____________________________________________  FINAL CLINICAL IMPRESSION(S) / ED DIAGNOSES  Final diagnoses:  Minor head injury, initial encounter  Contusion of scalp, initial encounter  Acute neck pain   Nicki Reaper, NP    Lorre Munroe, NP 08/02/18 2046    Dionne Bucy, MD 08/02/18 2322

## 2018-08-02 NOTE — ED Notes (Signed)
Pt states that she was in bathroom when she "woke up on the bathroom floor" and she hit her forehead on bathroom floor. Pt has no clue how long she was blacked out for.

## 2018-08-02 NOTE — ED Triage Notes (Signed)
First Nurse Note:  Arrives for evaluation of head injury. States slipped and hit head on the tub this morning.  ? LOC.  C/O persistent headache.  naprosyn taken at 1200 with minimal relief.  AAOx3.  Skin warm and dry.  NAD

## 2018-08-02 NOTE — Discharge Instructions (Addendum)
You were seen today for acute headache, acute neck pain status post head injury.  The CT scan of your head and neck is negative for any acute findings.  Please apply ice for 20 minutes twice daily to help improve pain and swelling.  You can also take Tylenol 1000 milligrams every 8 hours as needed for pain.

## 2019-07-08 ENCOUNTER — Encounter: Payer: Self-pay | Admitting: Emergency Medicine

## 2019-07-08 ENCOUNTER — Other Ambulatory Visit: Payer: Self-pay

## 2019-07-08 ENCOUNTER — Emergency Department: Payer: Self-pay

## 2019-07-08 ENCOUNTER — Emergency Department
Admission: EM | Admit: 2019-07-08 | Discharge: 2019-07-08 | Disposition: A | Discharge status: Home / Self Care | Payer: Self-pay | Attending: Emergency Medicine | Admitting: Emergency Medicine

## 2019-07-08 DIAGNOSIS — N390 Urinary tract infection, site not specified: Secondary | ICD-10-CM | POA: Insufficient documentation

## 2019-07-08 DIAGNOSIS — R42 Dizziness and giddiness: Secondary | ICD-10-CM | POA: Insufficient documentation

## 2019-07-08 LAB — URINALYSIS, COMPLETE (UACMP) WITH MICROSCOPIC
Bacteria, UA: NONE SEEN
Bilirubin Urine: NEGATIVE
Glucose, UA: NEGATIVE mg/dL
Hgb urine dipstick: NEGATIVE
Ketones, ur: NEGATIVE mg/dL
Nitrite: NEGATIVE
Protein, ur: NEGATIVE mg/dL
Specific Gravity, Urine: 1.025 (ref 1.005–1.030)
WBC, UA: >50 WBC/hpf — ABNORMAL HIGH (ref 0–5)
pH: 5 (ref 5.0–8.0)

## 2019-07-08 LAB — BASIC METABOLIC PANEL
Anion gap: 11 (ref 5–15)
BUN: 19 mg/dL (ref 6–20)
CO2: 23 mmol/L (ref 22–32)
Calcium: 9.1 mg/dL (ref 8.9–10.3)
Chloride: 105 mmol/L (ref 98–111)
Creatinine, Ser: 0.78 mg/dL (ref 0.44–1.00)
GFR calc Af Amer: >60 mL/min (ref >60)
GFR calc non Af Amer: >60 mL/min (ref >60)
Glucose, Bld: 131 mg/dL — ABNORMAL HIGH (ref 70–99)
Potassium: 4.3 mmol/L (ref 3.5–5.1)
Sodium: 139 mmol/L (ref 135–145)

## 2019-07-08 LAB — CBC
HCT: 42 % (ref 36.0–46.0)
Hemoglobin: 12.8 g/dL (ref 12.0–15.0)
MCH: 24 pg — ABNORMAL LOW (ref 26.0–34.0)
MCHC: 30.5 g/dL (ref 30.0–36.0)
MCV: 78.8 fL — ABNORMAL LOW (ref 80.0–100.0)
Platelets: 354 10*3/uL (ref 150–400)
RBC: 5.33 MIL/uL — ABNORMAL HIGH (ref 3.87–5.11)
RDW: 15.5 % (ref 11.5–15.5)
WBC: 6.4 10*3/uL (ref 4.0–10.5)
nRBC: 0 % (ref 0.0–0.2)

## 2019-07-08 LAB — POCT PREGNANCY, URINE: Preg Test, Ur: NEGATIVE

## 2019-07-08 LAB — TROPONIN I (HIGH SENSITIVITY): Troponin I (High Sensitivity): 3 ng/L (ref <18)

## 2019-07-08 MED ORDER — MECLIZINE HCL 25 MG PO TABS: 25.0000 mg | ORAL_TABLET | Freq: Three times a day (TID) | ORAL | 0 refills | Status: DC | PRN

## 2019-07-08 MED ORDER — FOSFOMYCIN TROMETHAMINE 3 G PO PACK
3.0000 g | PACK | Freq: Once | ORAL | Status: AC
  Administered 2019-07-08: 15:00:00 3 g via ORAL
  Filled 2019-07-08: qty 3

## 2019-07-08 NOTE — ED Notes (Signed)
EDP, Paduchowski to bedside. 

## 2019-07-08 NOTE — ED Provider Notes (Signed)
[facility] Emergency Department Provider Note  Time seen: 2:41 PM  I have reviewed the triage vital signs and the nursing notes.   HISTORY  Chief Complaint Dizziness and Chest Pain   HPI Megan Marsh is a 29 y.o. female with no significant past medical history presents to the emergency department for intermittent dizziness.  According to the patient over the past week or so she has been experiencing intermittent episodes of dizziness.  States that dizziness is worse if she stands up quickly or turns her head quickly describes a spinning type sensation.  Patient also states intermittent nausea as well.  Denies any fever cough shortness of breath.  States she awoke this morning with a mild amount of chest discomfort although it has since resolved.  Denies any dysuria hematuria vaginal bleeding or discharge.   Past Medical History:  Diagnosis Date  . Lumbar herniated disc     There are no problems to display for this patient.   History reviewed. No pertinent surgical history.  Prior to Admission medications   Medication Sig Start Date End Date Taking? Authorizing Provider  acetaminophen (TYLENOL) 500 MG tablet Take 1 tablet (500 mg total) by mouth every 6 (six) hours as needed. 06/24/18   Enid Derry, PA-C  albuterol (PROVENTIL HFA;VENTOLIN HFA) 108 (90 Base) MCG/ACT inhaler Inhale 2 puffs into the lungs every 6 (six) hours as needed for wheezing or shortness of breath. 07/10/18   Menshew, Charlesetta Ivory, PA-C    No Known Allergies  History reviewed. No pertinent family history.  Social History Social History   Tobacco Use  . Smoking status: Never Smoker  . Smokeless tobacco: Never Used  Substance Use Topics  . Alcohol use: No  . Drug use: No    Review of Systems Constitutional: Negative for fever. Cardiovascular: Negative for chest pain. Respiratory: Negative for shortness of breath. Gastrointestinal: Negative for abdominal  pain Genitourinary: Negative for urinary compaints Musculoskeletal: Negative for musculoskeletal complaints Neurological: Negative for headache All other ROS negative  ____________________________________________   PHYSICAL EXAM:  VITAL SIGNS: ED Triage Vitals [07/08/19 1221]  Enc Vitals Group     BP (!) 145/101     Pulse Rate (!) 102     Resp 18     Temp 98.4 F (36.9 C)     Temp Source Oral     SpO2 99 %     Weight (!) 310 lb (140.6 kg)     Height 5\' 5"  (1.651 m)     Head Circumference      Peak Flow      Pain Score 3     Pain Loc      Pain Edu?      Excl. in GC?    Constitutional: Alert and oriented. Well appearing and in no distress. Eyes: Normal exam ENT      Head: Normocephalic and atraumatic.      Mouth/Throat: Mucous membranes are moist. Cardiovascular: Normal rate, regular rhythm.  Respiratory: Normal respiratory effort without tachypnea nor retractions. Breath sounds are clear Gastrointestinal: Soft and nontender. No distention.   Musculoskeletal: Nontender with normal range of motion in all extremities.  Neurologic:  Normal speech and language. No gross focal neurologic deficits  Skin:  Skin is warm, dry and intact.  Psychiatric: Mood and affect are normal.  ____________________________________________    EKG  EKG viewed and interpreted by myself shows a sinus tachycardia at 103 bpm with a narrow QRS, normal axis, normal intervals, no concerning  ST changes.  ____________________________________________    RADIOLOGY  Chest x-ray is negative  ____________________________________________   INITIAL IMPRESSION / ASSESSMENT AND PLAN / ED COURSE  Pertinent labs & imaging results that were available during my care of the patient were reviewed by me and considered in my medical decision making (see chart for details).   Patient presents to the emergency department for intermittent dizziness and some mild chest discomfort this morning.  Denies any  chest pain currently.  Patient's work-up is reassuring including normal labs, negative troponin, normal x-ray and EKG.  Patient's description of her symptoms sounds very vertiginous.  Denies any symptoms at this time.  Reassuring physical exam.  Patient does have greater than 50 white cells denies any vaginal discharge.  We will treat with a one-time dose of fosfomycin and send a urine culture.  Given her vertiginous symptoms we will place patient on as needed meclizine and have her follow-up with her doctor.  Patient agreeable to plan of care.  Discussed return precautions.  Megan Marsh was evaluated in Emergency Department on 07/08/2019 for the symptoms described in the history of present illness. She was evaluated in the context of the global COVID-19 pandemic, which necessitated consideration that the patient might be at risk for infection with the SARS-CoV-2 virus that causes COVID-19. Institutional protocols and algorithms that pertain to the evaluation of patients at risk for COVID-19 are in a state of rapid change based on information released by regulatory bodies including the CDC and federal and state organizations. These policies and algorithms were followed during the patient's care in the ED.  ____________________________________________   FINAL CLINICAL IMPRESSION(S) / ED DIAGNOSES  Vertigo UTI   Harvest Dark, MD 07/08/19 1444

## 2019-07-08 NOTE — ED Notes (Signed)
Lab notified to add on Urine Culture. 

## 2019-07-08 NOTE — ED Triage Notes (Signed)
Pt has had dizziness mostly when changing position for 2 days.  Pt started with chest pain today.  VSS. Unlabored.  No travel or birth control.  Color WNL.

## 2019-07-09 LAB — URINE CULTURE

## 2020-01-06 IMAGING — CT CT CERVICAL SPINE W/O CM
2 of 11 series · 4 of 33 positions shown, 5 images · non-contrast
Comparison: None.

CLINICAL DATA: Head injury. Slipped and hit head on tub today.
Persistent headache. Initial encounter.

EXAM:
CT HEAD WITHOUT CONTRAST
CT CERVICAL SPINE WITHOUT CONTRAST
TECHNIQUE: Multidetector CT imaging of the head and cervical spine was
performed following the standard protocol without intravenous
contrast. Multiplanar CT image reconstructions of the cervical spine
were also generated.

[Series 8: sagittal bone · sagittal · 0.28mm/px · 2 of 45 slices shown]
[im 15/45  bone]
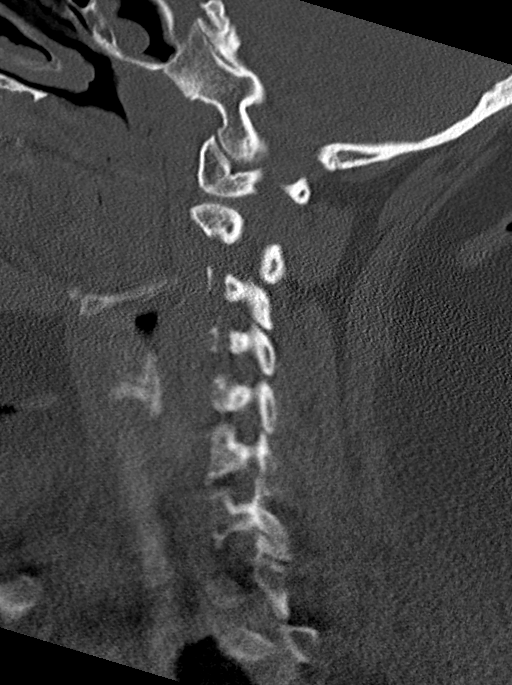
[im 30/45  bone]
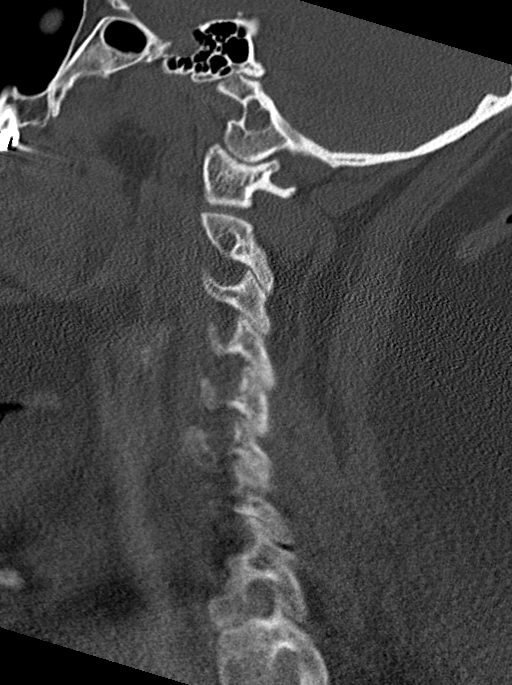

[Series 10: orthogonal bone · axial · 0.23mm/px · z∈[-179,-116]mm · 2 of 99 slices shown, 3 images]
[im 33/99  soft-tissue]
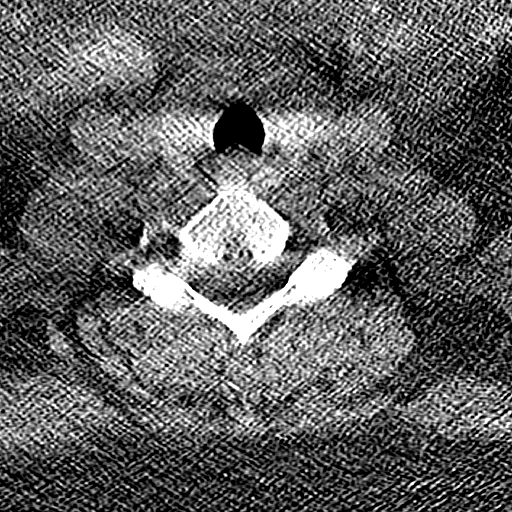
[im 33/99  bone]
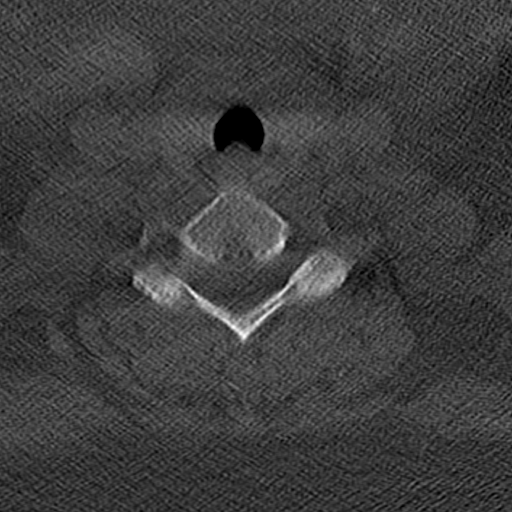
[im 66/99  bone]
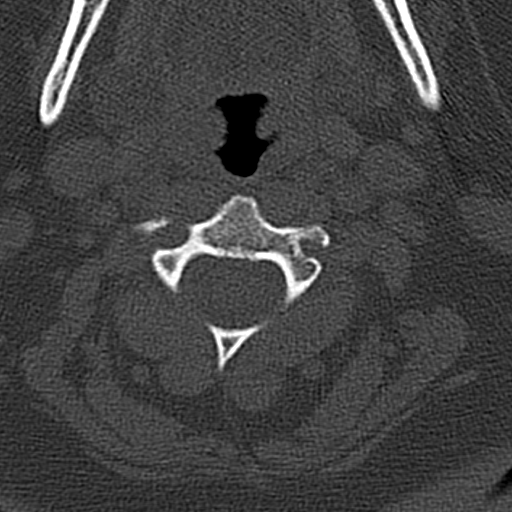

[4 of 33 positions shown; findings below may reference images not displayed]

FINDINGS: CT HEAD FINDINGS

Brain: There is no evidence of acute infarct, intracranial
hemorrhage, mass, midline shift, or extra-axial fluid collection.
The ventricles and sulci are normal.

Vascular: No hyperdense vessel.

Skull: No fracture or focal osseous lesion.

Sinuses/Orbits: Mucous retention cysts in the right sphenoid and
left maxillary sinuses. Clear mastoid air cells. Unremarkable
orbits.

Other: Mild frontal scalp swelling in the midline.

CT CERVICAL SPINE FINDINGS

Large body habitus results in increased image noise most notable
from C6-T1.

Alignment: Mild reversal of the normal cervical lordosis. No
listhesis.

Skull base and vertebrae: No acute fracture identified within above
described limitations. No destructive osseous process.

Soft tissues and spinal canal: No prevertebral fluid.

Disc levels:  Minimal cervical spondylosis.

Upper chest: Clear lung apices.

Other: Mildly enlarged level II lymph nodes measuring up to 13 mm in
short axis on the right and 12 mm on the left.
IMPRESSION: 1. No evidence of acute intracranial abnormality.
2. Mild frontal scalp swelling.
3. No acute cervical spine fracture identified with suboptimal
assessment from C6-T1.
4. Mild upper cervical lymphadenopathy, nonspecific but potentially
reactive. Clinical follow-up recommended with consideration for
follow-up neck CT if lymphadenopathy persists.

## 2023-11-01 DIAGNOSIS — R7301 Impaired fasting glucose: Secondary | ICD-10-CM | POA: Insufficient documentation

## 2023-11-01 DIAGNOSIS — E669 Obesity, unspecified: Secondary | ICD-10-CM | POA: Insufficient documentation

## 2023-11-01 DIAGNOSIS — I10 Essential (primary) hypertension: Secondary | ICD-10-CM | POA: Insufficient documentation

## 2023-11-01 DIAGNOSIS — R030 Elevated blood-pressure reading, without diagnosis of hypertension: Secondary | ICD-10-CM | POA: Insufficient documentation

## 2023-11-01 NOTE — Patient Instructions (Signed)

## 2023-11-06 ENCOUNTER — Encounter: Payer: Self-pay | Admitting: Nurse Practitioner

## 2023-11-06 ENCOUNTER — Ambulatory Visit (INDEPENDENT_AMBULATORY_CARE_PROVIDER_SITE_OTHER): Payer: Self-pay | Admitting: Nurse Practitioner

## 2023-11-06 VITALS — BP 136/80 | HR 80 | Temp 97.0°F | Ht 67.0 in | Wt 338.0 lb

## 2023-11-06 DIAGNOSIS — G44229 Chronic tension-type headache, not intractable: Secondary | ICD-10-CM

## 2023-11-06 DIAGNOSIS — F411 Generalized anxiety disorder: Secondary | ICD-10-CM | POA: Insufficient documentation

## 2023-11-06 DIAGNOSIS — F5101 Primary insomnia: Secondary | ICD-10-CM | POA: Insufficient documentation

## 2023-11-06 DIAGNOSIS — R030 Elevated blood-pressure reading, without diagnosis of hypertension: Secondary | ICD-10-CM | POA: Diagnosis not present

## 2023-11-06 DIAGNOSIS — R519 Headache, unspecified: Secondary | ICD-10-CM | POA: Insufficient documentation

## 2023-11-06 DIAGNOSIS — F321 Major depressive disorder, single episode, moderate: Secondary | ICD-10-CM | POA: Insufficient documentation

## 2023-11-06 DIAGNOSIS — Z8249 Family history of ischemic heart disease and other diseases of the circulatory system: Secondary | ICD-10-CM | POA: Insufficient documentation

## 2023-11-06 DIAGNOSIS — Z803 Family history of malignant neoplasm of breast: Secondary | ICD-10-CM | POA: Insufficient documentation

## 2023-11-06 DIAGNOSIS — Z6841 Body Mass Index (BMI) 40.0 and over, adult: Secondary | ICD-10-CM

## 2023-11-06 DIAGNOSIS — E66813 Obesity, class 3: Secondary | ICD-10-CM

## 2023-11-06 DIAGNOSIS — Z7689 Persons encountering health services in other specified circumstances: Secondary | ICD-10-CM

## 2023-11-06 DIAGNOSIS — R7301 Impaired fasting glucose: Secondary | ICD-10-CM

## 2023-11-06 MED ORDER — AMITRIPTYLINE HCL 10 MG PO TABS: 10.0000 mg | ORAL_TABLET | Freq: Every day | ORAL | 1 refills | Status: DC

## 2023-11-06 MED ORDER — ESCITALOPRAM OXALATE 10 MG PO TABS: 10.0000 mg | ORAL_TABLET | Freq: Every day | ORAL | 1 refills | Status: DC

## 2023-11-06 NOTE — Assessment & Plan Note (Signed)
 Chronic, has been off medication for 2 years and would like to restart.  Previously did well with Lexapro.  Will restart with dosing starting at 10 MG and adjust further as needed.  Denies SI/HI.  Discussed that drugs of the SSRI class can have side effects such as weight gain, sexual dysfunction, insomnia, headache, nausea. These medications are generally effective at alleviating symptoms of anxiety and/or depression and need 6 weeks for full affect.  BLACK BOX warning discussed.

## 2023-11-06 NOTE — Assessment & Plan Note (Signed)
 Chronic for 2-3 years.  Has some morning headaches.  Will order at home sleep study, would benefit from this based on symptoms.  ?some underlying OSA.  Discussed at length with her OSA and symptoms + testing.  Until study will start Amitriptyline 10 MG nightly which may benefit both headaches and sleep.  Will discontinue this in future if OSA present and able to get sleep improved.  Educated on medication.  Monitor use with Lexapro.

## 2023-11-06 NOTE — Assessment & Plan Note (Signed)
 In her sister in mid-30's.  She may benefit starting screening prior to 40 based on history.  Discussed with patient.

## 2023-11-06 NOTE — Assessment & Plan Note (Signed)
 Slight elevation above goal of 130/80.  Family history in mother of MI in 3's.  Monitor BP closely at home and in office, start medication as needed.  Labs next visit.

## 2023-11-06 NOTE — Assessment & Plan Note (Signed)
 In mother while in 26's, passed away from this.  Check patient labs at physical.

## 2023-11-06 NOTE — Progress Notes (Signed)
 New Patient Office Visit  Subjective    Patient ID: Megan Marsh, female    DOB: 05-Aug-1990  Age: 33 y.o. MRN: [id number]  CC:  Chief Complaint  Patient presents with   Anxiety    Patient states she has been on escitalopram in the past, states it worked ok. Wants to discuss starting back on medication   Depression    Patient states she has been on escitalopram in the past, states it worked ok. Wants to discuss starting back on medication   Insomnia    Patient states she has been having trouble sleeping for the last 2 to 3 years. States she feels like she gets very tired during the day. Also mentions having trouble going to sleep and staying asleep.    Headache    Patient states she has been getting about 3 headaches per week. States she tried not to take any medication but will occasionally take Acetaminophen .     HPI Megan Marsh presents for new patient visit to establish care.  Introduced to Publishing rights manager role and practice setting.  All questions answered.  Discussed provider/patient relationship and expectations.   INSOMNIA Has had issues with sleep for 2-3 years.  Tried Melatonin in the past.  Does notice having headaches more frequently, will have in the morning.  Is having headaches 3 times per week, some start in the morning and some during day.  Does a lot of computer work at home.  Wears glasses and contacts, eyes feel very tired at end of the day.  Had eye exam a few weeks ago. In past has not had headaches as bad, maybe 3 a month.  When present they are at worse an 8/10. Recently have been like a halo at the top of the head.  Occasionally gets nausea with them.  Does have sensitivity to light and sound.   Duration: chronic Satisfied with sleep quality: no Difficulty falling asleep: yes Difficulty staying asleep: yes Waking a few hours after sleep onset: yes Early morning awakenings: yes Daytime hypersomnolence: no Wakes feeling refreshed: no Good sleep hygiene:  yes Apnea: no Snoring: yes Depressed/anxious mood: yes Recent stress: no Restless legs/nocturnal leg cramps: no Chronic pain/arthritis: no History of sleep study: no Treatments attempted: melatonin    DEPRESSION/ANXIETY Has been off medication for about 2 years, took Lexapro in past. Mood status: uncontrolled Satisfied with current treatment?: yes Symptom severity: moderate  Duration of current treatment : chronic Side effects: no Psychotherapy/counseling: yes in the past  Depressed mood: yes Anxious mood: yes Anhedonia: sometimes Significant weight loss or gain: no Insomnia: yes hard to stay asleep and hard to fall asleep Fatigue: yes Feelings of worthlessness or guilt: no Impaired concentration/indecisiveness: no Suicidal ideations: no Hopelessness: yes Crying spells: yes    11/06/2023    9:27 AM  Depression screen PHQ 2/9  Decreased Interest 1  Down, Depressed, Hopeless 1  PHQ - 2 Score 2  Altered sleeping 3  Tired, decreased energy 2  Change in appetite 2  Feeling bad or failure about yourself  1  Trouble concentrating 2  Moving slowly or fidgety/restless 1  Suicidal thoughts 0  PHQ-9 Score 13  Difficult doing work/chores Somewhat difficult       11/06/2023    9:28 AM  GAD 7 : Generalized Anxiety Score  Nervous, Anxious, on Edge 2  Control/stop worrying 1  Worry too much - different things 2  Trouble relaxing 2  Restless 1  Easily annoyed or irritable 2  Afraid - awful might happen 1  Total GAD 7 Score 11  Anxiety Difficulty Somewhat difficult   Outpatient Encounter Medications as of 11/06/2023  Medication Sig   amitriptyline (ELAVIL) 10 MG tablet Take 1 tablet (10 mg total) by mouth at bedtime.   escitalopram (LEXAPRO) 10 MG tablet Take 1 tablet (10 mg total) by mouth daily.   [DISCONTINUED] acetaminophen  (TYLENOL ) 500 MG tablet Take 1 tablet (500 mg total) by mouth every 6 (six) hours as needed.   [DISCONTINUED] albuterol  (PROVENTIL  HFA;VENTOLIN  HFA)  108 (90 Base) MCG/ACT inhaler Inhale 2 puffs into the lungs every 6 (six) hours as needed for wheezing or shortness of breath.   [DISCONTINUED] meclizine  (ANTIVERT ) 25 MG tablet Take 1 tablet (25 mg total) by mouth 3 (three) times daily as needed for dizziness.   No facility-administered encounter medications on file as of 11/06/2023.    Past Medical History:  Diagnosis Date   Anxiety    Depression    Lumbar herniated disc     History reviewed. No pertinent surgical history.  Family History  Problem Relation Age of Onset   Hypertension Mother    Heart attack Mother    Sleep apnea Father    Breast cancer Sister    Breast cancer Paternal Grandmother    Depression Paternal Grandfather    Breast cancer Cousin    Colon cancer Cousin     Social History   Socioeconomic History   Marital status: Single    Spouse name: Not on file   Number of children: Not on file   Years of education: Not on file   Highest education level: Not on file  Occupational History   Not on file  Tobacco Use   Smoking status: Never   Smokeless tobacco: Never  Vaping Use   Vaping status: Never Used  Substance and Sexual Activity   Alcohol use: Yes    Comment: very rare   Drug use: No   Sexual activity: Yes  Other Topics Concern   Not on file  Social History Narrative   Not on file   Social Drivers of Health   Financial Resource Strain: Low Risk  (11/06/2023)   Overall Financial Resource Strain (CARDIA)    Difficulty of Paying Living Expenses: Not hard at all  Food Insecurity: No Food Insecurity (11/06/2023)   Hunger Vital Sign    Worried About Running Out of Food in the Last Year: Never true    Ran Out of Food in the Last Year: Never true  Transportation Needs: No Transportation Needs (11/06/2023)   PRAPARE - Administrator, Civil Service (Medical): No    Lack of Transportation (Non-Medical): No  Physical Activity: Insufficiently Active (11/06/2023)   Exercise Vital Sign    Days of  Exercise per Week: 2 days    Minutes of Exercise per Session: 30 min  Stress: No Stress Concern Present (11/06/2023)   [employer] of Occupational Health - Occupational Stress Questionnaire    Feeling of Stress : Not at all  Social Connections: Moderately Isolated (11/06/2023)   Social Connection and Isolation Panel [NHANES]    Frequency of Communication with Friends and Family: More than three times a week    Frequency of Social Gatherings with Friends and Family: More than three times a week    Attends Religious Services: Never    Database administrator or Organizations: No    Attends Banker Meetings: Never    Marital Status: Living with  partner  Intimate Partner Violence: Not At Risk (11/06/2023)   Humiliation, Afraid, Rape, and Kick questionnaire    Fear of Current or Ex-Partner: No    Emotionally Abused: No    Physically Abused: No    Sexually Abused: No    Review of Systems  Constitutional:  Positive for malaise/fatigue. Negative for chills and fever.  Respiratory:  Negative for cough, shortness of breath and wheezing.   Cardiovascular:  Negative for chest pain, palpitations, orthopnea and leg swelling.  Gastrointestinal: Negative.   Neurological:  Positive for headaches. Negative for dizziness and weakness.  Psychiatric/Behavioral:  Positive for depression. Negative for suicidal ideas. The patient is nervous/anxious and has insomnia.        Objective    BP 136/80 (BP Location: Left Arm, Patient Position: Sitting, Cuff Size: Large)   Pulse 80   Temp (!) 97 F (36.1 C) (Oral)   Ht 5\' 7"  (1.702 m)   Wt (!) 338 lb (153.3 kg)   LMP 10/29/2023 (Exact Date)   SpO2 97%   BMI 52.94 kg/m   Physical Exam Vitals and nursing note reviewed.  Constitutional:      General: She is awake. She is not in acute distress.    Appearance: She is well-developed and well-groomed. She is obese. She is not ill-appearing or toxic-appearing.  HENT:     Head: Normocephalic.      Right Ear: Hearing and external ear normal.     Left Ear: Hearing and external ear normal.     Nose: Nose normal.     Mouth/Throat:     Mouth: Mucous membranes are moist.     Pharynx: No pharyngeal swelling, oropharyngeal exudate or posterior oropharyngeal erythema.     Tonsils: 1+ on the right. 2+ on the left.  Eyes:     General: Lids are normal.        Right eye: No discharge.        Left eye: No discharge.     Conjunctiva/sclera: Conjunctivae normal.     Pupils: Pupils are equal, round, and reactive to light.  Neck:     Thyroid: No thyromegaly.     Vascular: No carotid bruit.  Cardiovascular:     Rate and Rhythm: Normal rate and regular rhythm.     Heart sounds: Normal heart sounds. No murmur heard.    No gallop.  Pulmonary:     Effort: Pulmonary effort is normal. No accessory muscle usage or respiratory distress.     Breath sounds: Normal breath sounds.  Abdominal:     General: Bowel sounds are normal. There is no distension.     Palpations: Abdomen is soft.     Tenderness: There is no abdominal tenderness.  Musculoskeletal:     Cervical back: Normal range of motion and neck supple.     Right lower leg: No edema.     Left lower leg: No edema.  Lymphadenopathy:     Cervical: No cervical adenopathy.  Skin:    General: Skin is warm and dry.  Neurological:     Mental Status: She is alert and oriented to person, place, and time.     Deep Tendon Reflexes: Reflexes are normal and symmetric.     Reflex Scores:      Brachioradialis reflexes are 2+ on the right side and 2+ on the left side.      Patellar reflexes are 2+ on the right side and 2+ on the left side. Psychiatric:  Attention and Perception: Attention normal.        Mood and Affect: Mood normal.        Speech: Speech normal.        Behavior: Behavior normal. Behavior is cooperative.        Thought Content: Thought content normal.    Last CBC Lab Results  Component Value Date   WBC 6.4 07/08/2019    HGB 12.8 07/08/2019   HCT 42.0 07/08/2019   MCV 78.8 (L) 07/08/2019   MCH 24.0 (L) 07/08/2019   RDW 15.5 07/08/2019   PLT 354 07/08/2019   Last metabolic panel Lab Results  Component Value Date   GLUCOSE 131 (H) 07/08/2019   NA 139 07/08/2019   K 4.3 07/08/2019   CL 105 07/08/2019   CO2 23 07/08/2019   BUN 19 07/08/2019   CREATININE 0.78 07/08/2019   GFRNONAA >60 07/08/2019   CALCIUM 9.1 07/08/2019   ANIONGAP 11 07/08/2019        Assessment & Plan:   Problem List Items Addressed This Visit       Endocrine   IFG (impaired fasting glucose)   Noticed on past labs, will check A1c at physical.        Other   Primary insomnia - Primary   Chronic for 2-3 years.  Has some morning headaches.  Will order at home sleep study, would benefit from this based on symptoms.  ?some underlying OSA.  Discussed at length with her OSA and symptoms + testing.  Until study will start Amitriptyline 10 MG nightly which may benefit both headaches and sleep.  Will discontinue this in future if OSA present and able to get sleep improved.  Educated on medication.  Monitor use with Lexapro.      Relevant Orders   Ambulatory referral to Sleep Studies   Obesity   BMI 52.94.  Recommended eating smaller high protein, low fat meals more frequently and exercising 30 mins a day 5 times a week with a goal of 10-15lb weight loss in the next 3 months. Patient voiced their understanding and motivation to adhere to these recommendations.       Headache   Refer to insomnia plan of care.      Relevant Medications   escitalopram (LEXAPRO) 10 MG tablet   amitriptyline (ELAVIL) 10 MG tablet   Generalized anxiety disorder   Refer to depression plan of care.      Relevant Medications   escitalopram (LEXAPRO) 10 MG tablet   amitriptyline (ELAVIL) 10 MG tablet   Family history of heart attack   In mother while in 23's, passed away from this.  Check patient labs at physical.      Family history of breast  cancer   In her sister in mid-30's.  She may benefit starting screening prior to 40 based on history.  Discussed with patient.      Elevated BP without diagnosis of hypertension   Slight elevation above goal of 130/80.  Family history in mother of MI in 18's.  Monitor BP closely at home and in office, start medication as needed.  Labs next visit.      Depression, major, single episode, moderate (HCC)   Chronic, has been off medication for 2 years and would like to restart.  Previously did well with Lexapro.  Will restart with dosing starting at 10 MG and adjust further as needed.  Denies SI/HI.  Discussed that drugs of the SSRI class can have side effects such as  weight gain, sexual dysfunction, insomnia, headache, nausea. These medications are generally effective at alleviating symptoms of anxiety and/or depression and need 6 weeks for full affect.  BLACK BOX warning discussed.      Relevant Medications   escitalopram (LEXAPRO) 10 MG tablet   amitriptyline (ELAVIL) 10 MG tablet   Other Visit Diagnoses       Encounter to establish care       New to practice, introduced to provider and setting.       Return in about 5 weeks (around 12/11/2023) for Annual Physical with pap.   Edan Juday T Logyn Dedominicis, NP

## 2023-11-06 NOTE — Assessment & Plan Note (Signed)
 BMI 52.94.  Recommended eating smaller high protein, low fat meals more frequently and exercising 30 mins a day 5 times a week with a goal of 10-15lb weight loss in the next 3 months. Patient voiced their understanding and motivation to adhere to these recommendations.

## 2023-11-06 NOTE — Assessment & Plan Note (Signed)
 Noticed on past labs, will check A1c at physical.

## 2023-11-06 NOTE — Assessment & Plan Note (Signed)
 Refer to depression plan of care.

## 2023-11-06 NOTE — Assessment & Plan Note (Signed)
 Refer to insomnia plan of care.

## 2023-11-22 ENCOUNTER — Encounter: Payer: Self-pay | Admitting: Nurse Practitioner

## 2023-12-08 NOTE — Patient Instructions (Signed)
 Be Involved in Caring For Your Health:  Taking Medications When medications are taken as directed, they can greatly improve your health. But if they are not taken as prescribed, they may not work. In some cases, not taking them correctly can be harmful. To help ensure your treatment remains effective and safe, understand your medications and how to take them. Bring your medications to each visit for review by your provider.  Your lab results, notes, and after visit summary will be available on My Chart. We strongly encourage you to use this feature. If lab results are abnormal the clinic will contact you with the appropriate steps. If the clinic does not contact you assume the results are satisfactory. You can always view your results on My Chart. If you have questions regarding your health or results, please contact the clinic during office hours. You can also ask questions on My Chart.  We at [facility] are grateful that you chose [place] to provide your care. We strive to provide evidence-based and compassionate care and are always looking for feedback. If you get a survey from the clinic please complete this so we can hear your opinions.  Managing Anxiety, Adult After being diagnosed with anxiety, you may be relieved to know why you have felt or behaved a certain way. You may also feel overwhelmed about the treatment ahead and what it will mean for your life. With care and support, you can manage your anxiety. How to manage lifestyle changes Understanding the difference between stress and anxiety Although stress can play a role in anxiety, it is not the same as anxiety. Stress is your body's reaction to life changes and events, both good and bad. Stress is often caused by something external, such as a deadline, test, or competition. It normally goes away after the event has ended and will last just a few hours. But, stress can be ongoing and can lead to more than just stress. Anxiety is  caused by something internal, such as imagining a terrible outcome or worrying that something will go wrong that will greatly upset you. Anxiety often does not go away even after the event is over, and it can become a long-term (chronic) worry. Lowering stress and anxiety Talk with your health care provider or a counselor to learn more about lowering anxiety and stress. They may suggest tension-reduction techniques, such as: Music. Spend time creating or listening to music that you enjoy and that inspires you. Mindfulness-based meditation. Practice being aware of your normal breaths while not trying to control your breathing. It can be done while sitting or walking. Centering prayer. Focus on a word, phrase, or sacred image that means something to you and brings you peace. Deep breathing. Expand your stomach and inhale slowly through your nose. Hold your breath for 3-5 seconds. Then breathe out slowly, letting your stomach muscles relax. Self-talk. Learn to notice and spot thought patterns that lead to anxiety reactions. Change those patterns to thoughts that feel peaceful. Muscle relaxation. Take time to tense muscles and then relax them. Choose a tension-reduction technique that fits your lifestyle and personality. These techniques take time and practice. Set aside 5-15 minutes a day to do them. Specialized therapists can offer counseling and training in these techniques. The training to help with anxiety may be covered by some insurance plans. Other things you can do to manage stress and anxiety include: Keeping a stress diary. This can help you learn what triggers your reaction and then learn ways  to manage your response. Thinking about how you react to certain situations. You may not be able to control everything, but you can control your response. Making time for activities that help you relax and not feeling guilty about spending your time in this way. Doing visual imagery. This involves  imagining or creating mental pictures to help you relax. Practicing yoga. Through yoga poses, you can lower tension and relax.  Medicines Medicines for anxiety include: Antidepressant medicines. These are usually prescribed for long-term daily control. Anti-anxiety medicines. These may be added in severe cases, especially when panic attacks occur. When used together, medicines, psychotherapy, and tension-reduction techniques may be the most effective treatment. Relationships Relationships can play a big part in helping you recover. Spend more time connecting with trusted friends and family members. Think about going to couples counseling if you have a partner, taking family education classes, or going to family therapy. Therapy can help you and others better understand your anxiety. How to recognize changes in your anxiety Everyone responds differently to treatment for anxiety. Recovery from anxiety happens when symptoms lessen and stop interfering with your daily life at home or work. This may mean that you will start to: Have better concentration and focus. Worry will interfere less in your daily thinking. Sleep better. Be less irritable. Have more energy. Have improved memory. Try to recognize when your condition is getting worse. Contact your provider if your symptoms interfere with home or work and you feel like your condition is not improving. Follow these instructions at home: Activity Exercise. Adults should: Exercise for at least 150 minutes each week. The exercise should increase your heart rate and make you sweat (moderate-intensity exercise). Do strengthening exercises at least twice a week. Get the right amount and quality of sleep. Most adults need 7-9 hours of sleep each night. Lifestyle  Eat a healthy diet that includes plenty of vegetables, fruits, whole grains, low-fat dairy products, and lean protein. Do not eat a lot of foods that are high in fats, added sugars, or salt  (sodium). Make choices that simplify your life. Do not use any products that contain nicotine or tobacco. These products include cigarettes, chewing tobacco, and vaping devices, such as e-cigarettes. If you need help quitting, ask your provider. Avoid caffeine, alcohol, and certain over-the-counter cold medicines. These may make you feel worse. Ask your pharmacist which medicines to avoid. General instructions Take over-the-counter and prescription medicines only as told by your provider. Keep all follow-up visits. This is to make sure you are managing your anxiety well or if you need more support. Where to find support You can get help and support from: Self-help groups. Online and [employer]. A trusted spiritual leader. Couples counseling. Family education classes. Family therapy. Where to find more information You may find that joining a support group helps you deal with your anxiety. The following sources can help you find counselors or support groups near you: Mental Health America: mentalhealthamerica.net Anxiety and Depression Association of [place] (ADAA): adaa.org The [employer] on Mental Illness (NAMI): nami.org Contact a health care provider if: You have a hard time staying focused or finishing tasks. You spend many hours a day feeling worried about everyday life. You are very tired because you cannot stop worrying. You start to have headaches or often feel tense. You have chronic nausea or diarrhea. Get help right away if: Your heart feels like it is racing. You have shortness of breath. You have thoughts of hurting yourself or others. Get help  right away if you feel like you may hurt yourself or others, or have thoughts about taking your own life. Go to your nearest emergency room or: Call 911. Call the National Suicide Prevention Lifeline at 765-482-1593 or 988. This is open 24 hours a day. Text the Crisis Text Line at 504 124 9896. This information is not  intended to replace advice given to you by your health care provider. Make sure you discuss any questions you have with your health care provider. Document Revised: 02/28/2022 Document Reviewed: 09/12/2020 Elsevier Patient Education  2024 [employer].

## 2023-12-14 ENCOUNTER — Encounter: Payer: Self-pay | Admitting: Nurse Practitioner

## 2023-12-14 ENCOUNTER — Ambulatory Visit (INDEPENDENT_AMBULATORY_CARE_PROVIDER_SITE_OTHER): Admitting: Nurse Practitioner

## 2023-12-14 VITALS — BP 139/86 | HR 78 | Temp 98.0°F | Ht 66.3 in | Wt 342.4 lb

## 2023-12-14 DIAGNOSIS — Z1159 Encounter for screening for other viral diseases: Secondary | ICD-10-CM | POA: Diagnosis not present

## 2023-12-14 DIAGNOSIS — F321 Major depressive disorder, single episode, moderate: Secondary | ICD-10-CM

## 2023-12-14 DIAGNOSIS — F411 Generalized anxiety disorder: Secondary | ICD-10-CM

## 2023-12-14 DIAGNOSIS — F5101 Primary insomnia: Secondary | ICD-10-CM

## 2023-12-14 DIAGNOSIS — Z114 Encounter for screening for human immunodeficiency virus [HIV]: Secondary | ICD-10-CM

## 2023-12-14 DIAGNOSIS — R030 Elevated blood-pressure reading, without diagnosis of hypertension: Secondary | ICD-10-CM

## 2023-12-14 DIAGNOSIS — Z0000 Encounter for general adult medical examination without abnormal findings: Secondary | ICD-10-CM | POA: Diagnosis not present

## 2023-12-14 DIAGNOSIS — R7301 Impaired fasting glucose: Secondary | ICD-10-CM

## 2023-12-14 DIAGNOSIS — Z124 Encounter for screening for malignant neoplasm of cervix: Secondary | ICD-10-CM

## 2023-12-14 DIAGNOSIS — E66813 Obesity, class 3: Secondary | ICD-10-CM

## 2023-12-14 DIAGNOSIS — G44229 Chronic tension-type headache, not intractable: Secondary | ICD-10-CM

## 2023-12-14 DIAGNOSIS — Z6841 Body Mass Index (BMI) 40.0 and over, adult: Secondary | ICD-10-CM

## 2023-12-14 MED ORDER — ESCITALOPRAM OXALATE 10 MG PO TABS: 10.0000 mg | ORAL_TABLET | Freq: Every day | ORAL | 3 refills | Status: DC

## 2023-12-14 MED ORDER — BUPROPION HCL ER (XL) 150 MG PO TB24: 150.0000 mg | ORAL_TABLET | Freq: Every day | ORAL | 5 refills | Status: DC

## 2023-12-14 MED ORDER — AMITRIPTYLINE HCL 10 MG PO TABS: 10.0000 mg | ORAL_TABLET | Freq: Every day | ORAL | 3 refills | Status: AC

## 2023-12-14 NOTE — Assessment & Plan Note (Signed)
 Noticed on past labs, will check A1c today and monitor closely.

## 2023-12-14 NOTE — Assessment & Plan Note (Signed)
 Slight elevation above goal of 130/80.  Family history in mother of MI in 75's.  Monitor BP closely at home and in office, start medication as needed.  Labs today and recheck BP next visit.

## 2023-12-14 NOTE — Assessment & Plan Note (Signed)
 Chronic, improving.  Denies SI/HI.  Will continue Lexapro  at 10 MG, but add on Wellbutrin  XL 150 MG which may benefit sex drive and reduce change of weight gain with SSRI.  Educated her on Wellbutrin  and side effects. Discussed that drugs of the SSRI class can have side effects such as weight gain, sexual dysfunction, insomnia, headache, nausea. BLACK BOX warning discussed.

## 2023-12-14 NOTE — Assessment & Plan Note (Signed)
 Refer to insomnia plan of care.

## 2023-12-14 NOTE — Assessment & Plan Note (Signed)
 Chronic for 2-3 years.  Headaches are improving when she remembers Amitriptyline .  Will reorder at home sleep study, would benefit from this based on symptoms -- has not had performed yet.  ?some underlying OSA.  Discussed at length with her OSA and symptoms + testing.  Will Amitriptyline  in future if OSA present and able to get sleep improved.  Educated on medication.  Monitor use with Lexapro .

## 2023-12-14 NOTE — Assessment & Plan Note (Signed)
 BMI 54.77.  Recommended eating smaller high protein, low fat meals more frequently and exercising 30 mins a day 5 times a week with a goal of 10-15lb weight loss in the next 3 months. Patient voiced their understanding and motivation to adhere to these recommendations.

## 2023-12-14 NOTE — Assessment & Plan Note (Signed)
 Refer to depression plan of care.

## 2023-12-14 NOTE — Progress Notes (Signed)
 BP 139/86 (BP Location: Left Arm, Cuff Size: Large)   Pulse 78   Temp 98 F (36.7 C) (Oral)   Ht 5' 6.3 (1.684 m)   Wt (!) 342 lb 6.4 oz (155.3 kg)   LMP 12/11/2023 (Exact Date)   SpO2 96%   BMI 54.77 kg/m    Subjective:    Patient ID: Megan Marsh, female    DOB: March 25, 1991, 33 y.o.   MRN: [id number]  HPI: Megan Marsh is a 33 y.o. female presenting on 12/14/2023 for comprehensive medical examination. Current medical complaints include:none  She currently lives with: significant other Menopausal Symptoms: no  DEPRESSION Restarted Lexapro  and overall doing.  Is occasionally taking Amitriptyline , does forgot, headaches are improving, sleep is the same.  Has noted some decline in sex drive over past 6 months. Mood status: improving Satisfied with current treatment?: yes Symptom severity: moderate  Duration of current treatment : chronic Side effects: no Medication compliance: good compliance Psychotherapy/counseling: yes in the past Depressed mood: occasional Anxious mood: yes Anhedonia: sometimes Significant weight loss or gain: no Insomnia: yes hard to fall asleep Fatigue: sometimes Feelings of worthlessness or guilt: no Impaired concentration/indecisiveness: no Suicidal ideations: no Hopelessness: no Crying spells: with her cycle    12/14/2023    9:32 AM 11/06/2023    9:27 AM  Depression screen PHQ 2/9  Decreased Interest 1 1  Down, Depressed, Hopeless 2 1  PHQ - 2 Score 3 2  Altered sleeping 2 3  Tired, decreased energy 2 2  Change in appetite 1 2  Feeling bad or failure about yourself  1 1  Trouble concentrating 1 2  Moving slowly or fidgety/restless 0 1  Suicidal thoughts 0 0  PHQ-9 Score 10 13  Difficult doing work/chores Somewhat difficult Somewhat difficult       12/14/2023    9:32 AM 11/06/2023    9:28 AM  GAD 7 : Generalized Anxiety Score  Nervous, Anxious, on Edge 2 2  Control/stop worrying 1 1  Worry too much - different things 1 2   Trouble relaxing 1 2  Restless 1 1  Easily annoyed or irritable 2 2  Afraid - awful might happen 0 1  Total GAD 7 Score 8 11  Anxiety Difficulty Somewhat difficult Somewhat difficult      07/10/2018    6:20 PM 08/02/2018    6:25 PM 07/08/2019   12:22 PM 11/06/2023    9:27 AM 12/14/2023    9:32 AM  Fall Risk  Falls in the past year?    0 0  Was there an injury with Fall?    0 0  Fall Risk Category Calculator    0 0  (RETIRED) Patient Fall Risk Level Low fall risk  Low fall risk  Low fall risk     Patient at Risk for Falls Due to    No Fall Risks No Fall Risks  Fall risk Follow up    Falls evaluation completed Falls evaluation completed     Data saved with a previous flowsheet row definition    Past Medical History:  Past Medical History:  Diagnosis Date   Anxiety    Arthritis    Depression    GERD (gastroesophageal reflux disease)    Lumbar herniated disc    Surgical History:  History reviewed. No pertinent surgical history.  Medications:  No current outpatient medications on file prior to visit.   No current facility-administered medications on file prior to visit.    Allergies:  No Known Allergies  Social History:  Social History   Socioeconomic History   Marital status: Single    Spouse name: Not on file   Number of children: Not on file   Years of education: Not on file   Highest education level: 12th grade  Occupational History   Not on file  Tobacco Use   Smoking status: Never   Smokeless tobacco: Never  Vaping Use   Vaping status: Never Used  Substance and Sexual Activity   Alcohol use: Yes    Comment: very rare   Drug use: No   Sexual activity: Yes    Birth control/protection: Abstinence  Other Topics Concern   Not on file  Social History Narrative   Not on file   Social Drivers of Health   Financial Resource Strain: Low Risk  (12/07/2023)   Overall Financial Resource Strain (CARDIA)    Difficulty of Paying Living Expenses: Not very hard   Food Insecurity: Food Insecurity Present (12/07/2023)   Hunger Vital Sign    Worried About Running Out of Food in the Last Year: Sometimes true    Ran Out of Food in the Last Year: Sometimes true  Transportation Needs: No Transportation Needs (12/07/2023)   PRAPARE - Administrator, Civil Service (Medical): No    Lack of Transportation (Non-Medical): No  Physical Activity: Insufficiently Active (12/07/2023)   Exercise Vital Sign    Days of Exercise per Week: 2 days    Minutes of Exercise per Session: 20 min  Stress: No Stress Concern Present (12/07/2023)   [employer] of Occupational Health - Occupational Stress Questionnaire    Feeling of Stress: Only a little  Social Connections: Socially Isolated (12/07/2023)   Social Connection and Isolation Panel    Frequency of Communication with Friends and Family: Once a week    Frequency of Social Gatherings with Friends and Family: Once a week    Attends Religious Services: Never    Database administrator or Organizations: No    Attends Engineer, structural: Not on file    Marital Status: Living with partner  Intimate Partner Violence: Not At Risk (11/06/2023)   Humiliation, Afraid, Rape, and Kick questionnaire    Fear of Current or Ex-Partner: No    Emotionally Abused: No    Physically Abused: No    Sexually Abused: No   Social History   Tobacco Use  Smoking Status Never  Smokeless Tobacco Never   Social History   Substance and Sexual Activity  Alcohol Use Yes   Comment: very rare    Family History:  Family History  Problem Relation Age of Onset   Hypertension Mother    Heart attack Mother    Sleep apnea Father    Breast cancer Sister    Cancer Sister    Breast cancer Paternal Grandmother    Depression Paternal Grandfather    Breast cancer Cousin    Colon cancer Cousin     Past medical history, surgical history, medications, allergies, family history and social history reviewed with patient today and  changes made to appropriate areas of the chart.   ROS All other ROS negative except what is listed above and in the HPI.      Objective:    BP 139/86 (BP Location: Left Arm, Cuff Size: Large)   Pulse 78   Temp 98 F (36.7 C) (Oral)   Ht 5' 6.3 (1.684 m)   Wt (!) 342 lb 6.4 oz (  155.3 kg)   LMP 12/11/2023 (Exact Date)   SpO2 96%   BMI 54.77 kg/m   Wt Readings from Last 3 Encounters:  12/14/23 (!) 342 lb 6.4 oz (155.3 kg)  11/06/23 (!) 338 lb (153.3 kg)  07/08/19 (!) 310 lb (140.6 kg)    Physical Exam Vitals and nursing note reviewed. Exam conducted with a chaperone present.  Constitutional:      General: She is awake. She is not in acute distress.    Appearance: She is well-developed and well-groomed. She is obese. She is not ill-appearing or toxic-appearing.  HENT:     Head: Normocephalic and atraumatic.     Right Ear: Hearing, tympanic membrane, ear canal and external ear normal. No drainage.     Left Ear: Hearing, tympanic membrane, ear canal and external ear normal. No drainage.     Nose: Nose normal.     Right Sinus: No maxillary sinus tenderness or frontal sinus tenderness.     Left Sinus: No maxillary sinus tenderness or frontal sinus tenderness.     Mouth/Throat:     Mouth: Mucous membranes are moist.     Pharynx: Oropharynx is clear. Uvula midline. No pharyngeal swelling, oropharyngeal exudate or posterior oropharyngeal erythema.  Eyes:     General: Lids are normal.        Right eye: No discharge.        Left eye: No discharge.     Extraocular Movements: Extraocular movements intact.     Conjunctiva/sclera: Conjunctivae normal.     Pupils: Pupils are equal, round, and reactive to light.     Visual Fields: Right eye visual fields normal and left eye visual fields normal.  Neck:     Thyroid: No thyromegaly.     Vascular: No carotid bruit.     Trachea: Trachea normal.  Cardiovascular:     Rate and Rhythm: Normal rate and regular rhythm.     Heart sounds:  Normal heart sounds. No murmur heard.    No gallop.  Pulmonary:     Effort: Pulmonary effort is normal. No accessory muscle usage or respiratory distress.     Breath sounds: Normal breath sounds.  Chest:  Breasts:    Right: Normal.     Left: Normal.  Abdominal:     General: Bowel sounds are normal.     Palpations: Abdomen is soft. There is no hepatomegaly or splenomegaly.     Tenderness: There is no abdominal tenderness.  Musculoskeletal:        General: Normal range of motion.     Cervical back: Normal range of motion and neck supple.     Right lower leg: No edema.     Left lower leg: No edema.  Lymphadenopathy:     Head:     Right side of head: No submental, submandibular, tonsillar, preauricular or posterior auricular adenopathy.     Left side of head: No submental, submandibular, tonsillar, preauricular or posterior auricular adenopathy.     Cervical: No cervical adenopathy.     Upper Body:     Right upper body: No supraclavicular, axillary or pectoral adenopathy.     Left upper body: No supraclavicular, axillary or pectoral adenopathy.  Skin:    General: Skin is warm and dry.     Capillary Refill: Capillary refill takes less than 2 seconds.     Findings: No rash.  Neurological:     Mental Status: She is alert and oriented to person, place, and time.  Gait: Gait is intact.     Deep Tendon Reflexes: Reflexes are normal and symmetric.     Reflex Scores:      Brachioradialis reflexes are 2+ on the right side and 2+ on the left side.      Patellar reflexes are 2+ on the right side and 2+ on the left side. Psychiatric:        Attention and Perception: Attention normal.        Mood and Affect: Mood normal.        Speech: Speech normal.        Behavior: Behavior normal. Behavior is cooperative.        Thought Content: Thought content normal.        Judgment: Judgment normal.    Results for orders placed or performed during the hospital encounter of 07/08/19  Basic  metabolic panel   Collection Time: 07/08/19 12:25 PM  Result Value Ref Range   Sodium 139 135 - 145 mmol/L   Potassium 4.3 3.5 - 5.1 mmol/L   Chloride 105 98 - 111 mmol/L   CO2 23 22 - 32 mmol/L   Glucose, Bld 131 (H) 70 - 99 mg/dL   BUN 19 6 - 20 mg/dL   Creatinine, Ser 9.21 0.44 - 1.00 mg/dL   Calcium 9.1 8.9 - 89.6 mg/dL   GFR calc non Af Amer >60 >60 mL/min   GFR calc Af Amer >60 >60 mL/min   Anion gap 11 5 - 15  CBC   Collection Time: 07/08/19 12:25 PM  Result Value Ref Range   WBC 6.4 4.0 - 10.5 K/uL   RBC 5.33 (H) 3.87 - 5.11 MIL/uL   Hemoglobin 12.8 12.0 - 15.0 g/dL   HCT 57.9 63.9 - 53.9 %   MCV 78.8 (L) 80.0 - 100.0 fL   MCH 24.0 (L) 26.0 - 34.0 pg   MCHC 30.5 30.0 - 36.0 g/dL   RDW 84.4 88.4 - 84.4 %   Platelets 354 150 - 400 K/uL   nRBC 0.0 0.0 - 0.2 %  Urinalysis, Complete w Microscopic   Collection Time: 07/08/19 12:25 PM  Result Value Ref Range   Color, Urine YELLOW (A) YELLOW   APPearance CLOUDY (A) CLEAR   Specific Gravity, Urine 1.025 1.005 - 1.030   pH 5.0 5.0 - 8.0   Glucose, UA NEGATIVE NEGATIVE mg/dL   Hgb urine dipstick NEGATIVE NEGATIVE   Bilirubin Urine NEGATIVE NEGATIVE   Ketones, ur NEGATIVE NEGATIVE mg/dL   Protein, ur NEGATIVE NEGATIVE mg/dL   Nitrite NEGATIVE NEGATIVE   Leukocytes,Ua LARGE (A) NEGATIVE   RBC / HPF 0-5 0 - 5 RBC/hpf   WBC, UA >50 (H) 0 - 5 WBC/hpf   Bacteria, UA NONE SEEN NONE SEEN   Squamous Epithelial / HPF 6-10 0 - 5   Mucus PRESENT   Troponin I (High Sensitivity)   Collection Time: 07/08/19 12:25 PM  Result Value Ref Range   Troponin I (High Sensitivity) 3 <18 ng/L  Pregnancy, urine POC   Collection Time: 07/08/19 12:35 PM  Result Value Ref Range   Preg Test, Ur NEGATIVE NEGATIVE  Urine Culture   Collection Time: 07/08/19  2:39 PM   Specimen: Urine, Random  Result Value Ref Range   Specimen Description      URINE, RANDOM Performed at [facility], [address] [place], [place] [address]     Special Requests      NONE Performed at [facility], [address] [place], [place] [address]    Culture MULTIPLE SPECIES PRESENT, SUGGEST RECOLLECTION (A)    Report Status 07/09/2019 FINAL       Assessment & Plan:   Problem List Items Addressed This Visit       Endocrine   IFG (impaired fasting glucose)   Noticed on past labs, will check A1c today and monitor closely.      Relevant Orders   Comprehensive metabolic panel with GFR   HgB J8r     Other   Primary insomnia   Chronic for 2-3 years.  Headaches are improving when she remembers Amitriptyline .  Will reorder at home sleep study, would benefit from this based on symptoms -- has not had performed yet.  ?some underlying OSA.  Discussed at length with her OSA and symptoms + testing.  Will Amitriptyline  in future if OSA present and able to get sleep improved.  Educated on medication.  Monitor use with Lexapro .      Relevant Orders   Ambulatory referral to Sleep Studies   Obesity   BMI 54.77.  Recommended eating smaller high protein, low fat meals more frequently and exercising 30 mins a day 5 times a week with a goal of 10-15lb weight loss in the next 3 months. Patient voiced their understanding and motivation to adhere to these recommendations.       Headache   Refer to insomnia plan of care.      Relevant Medications   buPROPion  (WELLBUTRIN  XL) 150 MG 24 hr tablet   escitalopram  (LEXAPRO ) 10 MG tablet   amitriptyline  (ELAVIL ) 10 MG tablet   Generalized anxiety disorder   Refer to depression plan of care.      Relevant Medications   buPROPion  (WELLBUTRIN  XL) 150 MG 24 hr tablet   escitalopram  (LEXAPRO ) 10 MG tablet   amitriptyline  (ELAVIL ) 10 MG tablet   Elevated BP without diagnosis of hypertension   Slight elevation above goal of 130/80.  Family history in mother of MI in 54's.  Monitor BP closely at home and in office, start medication as needed.  Labs today and recheck BP next visit.       Relevant Orders   Comprehensive metabolic panel with GFR   Lipid Panel w/o Chol/HDL Ratio   Depression, major, single episode, moderate (HCC) - Primary   Chronic, improving.  Denies SI/HI.  Will continue Lexapro  at 10 MG, but add on Wellbutrin  XL 150 MG which may benefit sex drive and reduce change of weight gain with SSRI.  Educated her on Wellbutrin  and side effects. Discussed that drugs of the SSRI class can have side effects such as weight gain, sexual dysfunction, insomnia, headache, nausea. BLACK BOX warning discussed.      Relevant Medications   buPROPion  (WELLBUTRIN  XL) 150 MG 24 hr tablet   escitalopram  (LEXAPRO ) 10 MG tablet   amitriptyline  (ELAVIL ) 10 MG tablet   Other Visit Diagnoses       Encounter for screening for HIV       HIV screen on labs today per guidelines for one time screening, discussed with patient.   Relevant Orders   HIV Antibody (routine testing w rflx)     Need for hepatitis C screening test       Hep C screen on labs today per guidelines for one time screening, discussed with patient.   Relevant Orders   Hepatitis C antibody     Encounter for annual physical exam       Annual physical today with  labs and health maintenance reviewed, discussed with patient.   Relevant Orders   CBC with Differential/Platelet   TSH        Follow up plan: Return in about 4 weeks (around 01/11/2024) for Depression, ANXIETY -- added on Wellbutrin  XL 150 MG.   LABORATORY TESTING:  - Pap smear: Will perform next visit, on cycle at present  IMMUNIZATIONS:   - Tdap: Tetanus vaccination status reviewed: last tetanus booster within 10 years. - Influenza: Up to date - Pneumovax: Not applicable - Prevnar: Not applicable - COVID: Refused - HPV: Refused - Shingrix vaccine: Not applicable  SCREENING: -Mammogram: Not applicable  - Colonoscopy: Not applicable  - Bone Density: Not applicable  -Hearing Test: Not applicable  -Spirometry: Not applicable   PATIENT  COUNSELING:   Advised to take 1 mg of folate supplement per day if capable of pregnancy.   Sexuality: Discussed sexually transmitted diseases, partner selection, use of condoms, avoidance of unintended pregnancy  and contraceptive alternatives.   Advised to avoid cigarette smoking.  I discussed with the patient that most people either abstain from alcohol or drink within safe limits (<=14/week and <=4 drinks/occasion for males, <=7/weeks and <= 3 drinks/occasion for females) and that the risk for alcohol disorders and other health effects rises proportionally with the number of drinks per week and how often a drinker exceeds daily limits.  Discussed cessation/primary prevention of drug use and availability of treatment for abuse.   Diet: Encouraged to adjust caloric intake to maintain  or achieve ideal body weight, to reduce intake of dietary saturated fat and total fat, to limit sodium intake by avoiding high sodium foods and not adding table salt, and to maintain adequate dietary potassium and calcium preferably from fresh fruits, vegetables, and low-fat dairy products.    stressed the importance of regular exercise  Injury prevention: Discussed safety belts, safety helmets, smoke detector, smoking near bedding or upholstery.   Dental health: Discussed importance of regular tooth brushing, flossing, and dental visits.    NEXT PREVENTATIVE PHYSICAL DUE IN 1 YEAR. Return in about 4 weeks (around 01/11/2024) for Depression, ANXIETY -- added on Wellbutrin  XL 150 MG.

## 2023-12-15 ENCOUNTER — Ambulatory Visit: Payer: Self-pay | Admitting: Nurse Practitioner

## 2023-12-15 LAB — COMPREHENSIVE METABOLIC PANEL WITH GFR
ALT: 12 IU/L (ref 0–32)
AST: 15 IU/L (ref 0–40)
Albumin: 3.7 g/dL — ABNORMAL LOW (ref 3.9–4.9)
Alkaline Phosphatase: 80 IU/L (ref 44–121)
BUN/Creatinine Ratio: 28 — ABNORMAL HIGH (ref 9–23)
BUN: 20 mg/dL (ref 6–20)
Bilirubin Total: 0.2 mg/dL (ref 0.0–1.2)
CO2: 21 mmol/L (ref 20–29)
Calcium: 8.8 mg/dL (ref 8.7–10.2)
Chloride: 105 mmol/L (ref 96–106)
Creatinine, Ser: 0.71 mg/dL (ref 0.57–1.00)
Globulin, Total: 2.8 g/dL (ref 1.5–4.5)
Glucose: 82 mg/dL (ref 70–99)
Potassium: 4.3 mmol/L (ref 3.5–5.2)
Sodium: 139 mmol/L (ref 134–144)
Total Protein: 6.5 g/dL (ref 6.0–8.5)
eGFR: 114 mL/min/1.73 (ref >59)

## 2023-12-15 LAB — CBC WITH DIFFERENTIAL/PLATELET
Basophils Absolute: 0 x10E3/uL (ref 0.0–0.2)
Basos: 0 %
EOS (ABSOLUTE): 0.1 x10E3/uL (ref 0.0–0.4)
Eos: 2 %
Hematocrit: 38.5 % (ref 34.0–46.6)
Hemoglobin: 11.7 g/dL (ref 11.1–15.9)
Immature Grans (Abs): 0 x10E3/uL (ref 0.0–0.1)
Immature Granulocytes: 0 %
Lymphocytes Absolute: 2.2 x10E3/uL (ref 0.7–3.1)
Lymphs: 34 %
MCH: 23.5 pg — ABNORMAL LOW (ref 26.6–33.0)
MCHC: 30.4 g/dL — ABNORMAL LOW (ref 31.5–35.7)
MCV: 77 fL — ABNORMAL LOW (ref 79–97)
Monocytes Absolute: 0.3 x10E3/uL (ref 0.1–0.9)
Monocytes: 5 %
Neutrophils Absolute: 3.8 x10E3/uL (ref 1.4–7.0)
Neutrophils: 59 %
Platelets: 370 x10E3/uL (ref 150–450)
RBC: 4.98 x10E6/uL (ref 3.77–5.28)
RDW: 16.4 % — ABNORMAL HIGH (ref 11.7–15.4)
WBC: 6.4 x10E3/uL (ref 3.4–10.8)

## 2023-12-15 LAB — LIPID PANEL W/O CHOL/HDL RATIO
Cholesterol, Total: 222 mg/dL — ABNORMAL HIGH (ref 100–199)
HDL: 47 mg/dL (ref >39)
LDL Chol Calc (NIH): 162 mg/dL — ABNORMAL HIGH (ref 0–99)
Triglycerides: 75 mg/dL (ref 0–149)
VLDL Cholesterol Cal: 13 mg/dL (ref 5–40)

## 2023-12-15 LAB — HEMOGLOBIN A1C
Est. average glucose Bld gHb Est-mCnc: 120 mg/dL
Hgb A1c MFr Bld: 5.8 % — ABNORMAL HIGH (ref 4.8–5.6)

## 2023-12-15 LAB — HEPATITIS C ANTIBODY: Hep C Virus Ab: NONREACTIVE

## 2023-12-15 LAB — HIV ANTIBODY (ROUTINE TESTING W REFLEX): HIV Screen 4th Generation wRfx: NONREACTIVE

## 2023-12-15 LAB — TSH: TSH: 2.49 u[IU]/mL (ref 0.450–4.500)

## 2023-12-15 NOTE — Progress Notes (Signed)
 Contacted via MyChart  Good morning Megan Marsh, your labs have returned: - - Kidney function, creatinine and eGFR, remains normal, as is liver function, AST and ALT.  - Lipid panel is showing elevation in LDL, bad cholesterol, and total cholesterol.  You do not need medication at this time, but I recommend heavy focus on healthy diet changes and regular activity that you enjoy and gets your heart rate up a little. - A1c is in prediabetes range, 5.7 to 6.4%, we will continue to monitor this at visits to ensure no trending up. - Remainder of labs overall stable.  Any questions? Keep being amazing!!  Thank you for allowing me to participate in your care.  I appreciate you. Kindest regards, Ilena Dieckman

## 2023-12-20 ENCOUNTER — Encounter: Admitting: Nurse Practitioner

## 2024-01-26 NOTE — Patient Instructions (Signed)
 Be Involved in Caring For Your Health:  Taking Medications When medications are taken as directed, they can greatly improve your health. But if they are not taken as prescribed, they may not work. In some cases, not taking them correctly can be harmful. To help ensure your treatment remains effective and safe, understand your medications and how to take them. Bring your medications to each visit for review by your provider.  Your lab results, notes, and after visit summary will be available on My Chart. We strongly encourage you to use this feature. If lab results are abnormal the clinic will contact you with the appropriate steps. If the clinic does not contact you assume the results are satisfactory. You can always view your results on My Chart. If you have questions regarding your health or results, please contact the clinic during office hours. You can also ask questions on My Chart.  We at [facility] are grateful that you chose us  to provide your care. We strive to provide evidence-based and compassionate care and are always looking for feedback. If you get a survey from the clinic please complete this so we can hear your opinions.  Managing Depression, Adult Depression is a mental health condition that affects your thoughts, feelings, and actions. Being diagnosed with depression can bring you relief if you did not know why you have felt or behaved a certain way. It could also leave you feeling overwhelmed. Finding ways to manage your symptoms can help you feel more positive about your future. How to manage lifestyle changes Being depressed is difficult. Depression can increase the level of everyday stress. Stress can make depression symptoms worse. You may believe your symptoms cannot be managed or will never improve. However, there are many things you can try to help manage your symptoms. There is hope. Managing stress  Stress is your body's reaction to life changes and events,  both good and bad. Stress can add to your feelings of depression. Learning to manage your stress can help lessen your feelings of depression. Try some of the following approaches to reducing your stress (stress reduction techniques): Listen to music that you enjoy and that inspires you. Try using a meditation app or take a meditation class. Develop a practice that helps you connect with your spiritual self. Walk in nature, pray, or go to a place of worship. Practice deep breathing. To do this, inhale slowly through your nose. Pause at the top of your inhale for a few seconds and then exhale slowly, letting yourself relax. Repeat this three or four times. Practice yoga to help relax and work your muscles. Choose a stress reduction technique that works for you. These techniques take time and practice to develop. Set aside 5-15 minutes a day to do them. Therapists can offer training in these techniques. Do these things to help manage stress: Keep a journal. Know your limits. Set healthy boundaries for yourself and others, such as saying no when you think something is too much. Pay attention to how you react to certain situations. You may not be able to control everything, but you can change your reaction. Add humor to your life by watching funny movies or shows. Make time for activities that you enjoy and that relax you. Spend less time using electronics, especially at night before bed. The light from screens can make your brain think it is time to get up rather than go to bed.  Medicines Medicines, such as antidepressants, are often a part of  treatment for depression. Talk with your pharmacist or health care provider about all the medicines, supplements, and herbal products that you take, their possible side effects, and what medicines and other products are safe to take together. Make sure to report any side effects you may have to your health care provider. Relationships Your health care  provider may suggest family therapy, couples therapy, or individual therapy as part of your treatment. How to recognize changes Everyone responds differently to treatment for depression. As you recover from depression, you may start to: Have more interest in doing activities. Feel more hopeful. Have more energy. Eat a more regular amount of food. Have better mental focus. It is important to recognize if your depression is not getting better or is getting worse. The symptoms you had in the beginning may return, such as: Feeling tired. Eating too much or too little. Sleeping too much or too little. Feeling restless, agitated, or hopeless. Trouble focusing or making decisions. Having unexplained aches and pains. Feeling irritable, angry, or aggressive. If you or your family members notice these symptoms coming back, let your health care provider know right away. Follow these instructions at home: Activity Try to get some form of exercise each day, such as walking. Try yoga, mindfulness, or other stress reduction techniques. Participate in group activities if you are able. Lifestyle Get enough sleep. Cut down on or stop using caffeine, tobacco, alcohol, and any other harmful substances. Eat a healthy diet that includes plenty of vegetables, fruits, whole grains, low-fat dairy products, and lean protein. Limit foods that are high in solid fats, added sugar, or salt (sodium). General instructions Take over-the-counter and prescription medicines only as told by your health care provider. Keep all follow-up visits. It is important for your health care provider to check on your mood, behavior, and medicines. Your health care provider may need to make changes to your treatment. Where to find support Talking to others  Friends and family members can be sources of support and guidance. Talk to trusted friends or family members about your condition. Explain your symptoms and let them know that you  are working with a health care provider to treat your depression. Tell friends and family how they can help. Finances Find mental health providers that fit with your financial situation. Talk with your health care provider if you are worried about access to food, housing, or medicine. Call your insurance company to learn about your co-pays and prescription plan. Where to find more information You can find support in your area from: Anxiety and Depression Association of America (ADAA): adaa.org Mental Health America: mentalhealthamerica.net The [employer] on Mental Illness: nami.org Contact a health care provider if: You stop taking your antidepressant medicines, and you have any of these symptoms: Nausea. Headache. Light-headedness. Chills and body aches. Not being able to sleep (insomnia). You or your friends and family think your depression is getting worse. Get help right away if: You have thoughts of hurting yourself or others. Get help right away if you feel like you may hurt yourself or others, or have thoughts about taking your own life. Go to your nearest emergency room or: Call 911. Call the National Suicide Prevention Lifeline at (743) 630-7432 or 988. This is open 24 hours a day. Text the Crisis Text Line at 854-590-3828. This information is not intended to replace advice given to you by your health care provider. Make sure you discuss any questions you have with your health care provider. Document Revised: 09/27/2021 Document Reviewed:  09/27/2021 Elsevier Patient Education  2024 [employer].

## 2024-01-30 ENCOUNTER — Ambulatory Visit: Admitting: Nurse Practitioner

## 2024-01-30 ENCOUNTER — Encounter: Payer: Self-pay | Admitting: Nurse Practitioner

## 2024-01-30 ENCOUNTER — Other Ambulatory Visit (HOSPITAL_COMMUNITY)
Admission: RE | Admit: 2024-01-30 | Discharge: 2024-01-30 | Disposition: A | Discharge status: Home / Self Care | Source: Ambulatory Visit | Attending: Nurse Practitioner | Admitting: Nurse Practitioner

## 2024-01-30 VITALS — BP 128/78 | HR 88 | Temp 97.8°F | Ht 66.3 in | Wt 343.2 lb

## 2024-01-30 DIAGNOSIS — Z124 Encounter for screening for malignant neoplasm of cervix: Secondary | ICD-10-CM | POA: Diagnosis present

## 2024-01-30 DIAGNOSIS — F411 Generalized anxiety disorder: Secondary | ICD-10-CM | POA: Diagnosis not present

## 2024-01-30 DIAGNOSIS — F321 Major depressive disorder, single episode, moderate: Secondary | ICD-10-CM

## 2024-01-30 DIAGNOSIS — F5101 Primary insomnia: Secondary | ICD-10-CM

## 2024-01-30 MED ORDER — ESCITALOPRAM OXALATE 10 MG PO TABS: 15.0000 mg | ORAL_TABLET | Freq: Every day | ORAL | 2 refills | Status: AC

## 2024-01-30 NOTE — Progress Notes (Signed)
 BP 128/78 (BP Location: Left Arm, Patient Position: Sitting, Cuff Size: Large)   Pulse 88   Temp 97.8 F (36.6 C) (Oral)   Ht 5' 6.3 (1.684 m)   Wt (!) 343 lb 3.2 oz (155.7 kg)   LMP 12/14/2023 (Approximate)   SpO2 98%   BMI 54.89 kg/m    Subjective:    Patient ID: Megan Marsh, female    DOB: 1990/08/12, 33 y.o.   MRN: [id number]  HPI: Megan Marsh is a 33 y.o. female  Chief Complaint  Patient presents with   Depression   Anxiety   DEPRESSION/ANXIETY Currently taking Wellbutrin  XL 150 MG and Lexapro  10 MG daily.  Elavil  at night when takes Amitriptyline  at night, which also helps her sleep. Feels mood is sort of steady at present.  Having stressors at present with insurance coverage. Mood status: uncontrolled Satisfied with current treatment?: yes Symptom severity: moderate  Duration of current treatment : chronic Side effects: no Medication compliance: good compliance Psychotherapy/counseling: yes in the past Depressed mood: yes Anxious mood: yes Anhedonia: no Significant weight loss or gain: no Insomnia: yes hard to fall asleep -- if remembers to take Amitriptyline  is better Fatigue: yes Feelings of worthlessness or guilt: no Impaired concentration/indecisiveness: sometimes Suicidal ideations: no Hopelessness: no Crying spells: yes    01/30/2024    8:17 AM 12/14/2023    9:32 AM 11/06/2023    9:27 AM  Depression screen PHQ 2/9  Decreased Interest 1 1 1   Down, Depressed, Hopeless 1 2 1   PHQ - 2 Score 2 3 2   Altered sleeping 2 2 3   Tired, decreased energy 2 2 2   Change in appetite 2 1 2   Feeling bad or failure about yourself  1 1 1   Trouble concentrating 3 1 2   Moving slowly or fidgety/restless 0 0 1  Suicidal thoughts 0 0 0  PHQ-9 Score 12 10 13   Difficult doing work/chores Somewhat difficult Somewhat difficult Somewhat difficult       01/30/2024    8:18 AM 12/14/2023    9:32 AM 11/06/2023    9:28 AM  GAD 7 : Generalized Anxiety Score  Nervous,  Anxious, on Edge 2 2 2   Control/stop worrying 1 1 1   Worry too much - different things 1 1 2   Trouble relaxing 1 1 2   Restless 1 1 1   Easily annoyed or irritable 2 2 2   Afraid - awful might happen 0 0 1  Total GAD 7 Score 8 8 11   Anxiety Difficulty Somewhat difficult Somewhat difficult Somewhat difficult   Relevant past medical, surgical, family and social history reviewed and updated as indicated. Interim medical history since our last visit reviewed. Allergies and medications reviewed and updated.  Review of Systems  Constitutional:  Negative for activity change, appetite change, diaphoresis, fatigue and fever.  Respiratory:  Negative for cough, chest tightness, shortness of breath and wheezing.   Cardiovascular:  Negative for chest pain, palpitations and leg swelling.  Gastrointestinal: Negative.   Neurological:  Positive for headaches.  Psychiatric/Behavioral:  Positive for sleep disturbance. Negative for decreased concentration, self-injury and suicidal ideas. The patient is nervous/anxious.     Per HPI unless specifically indicated above     Objective:    BP 128/78 (BP Location: Left Arm, Patient Position: Sitting, Cuff Size: Large)   Pulse 88   Temp 97.8 F (36.6 C) (Oral)   Ht 5' 6.3 (1.684 m)   Wt (!) 343 lb 3.2 oz (155.7 kg)   LMP 12/14/2023 (  Approximate)   SpO2 98%   BMI 54.89 kg/m   Wt Readings from Last 3 Encounters:  01/30/24 (!) 343 lb 3.2 oz (155.7 kg)  12/14/23 (!) 342 lb 6.4 oz (155.3 kg)  11/06/23 (!) 338 lb (153.3 kg)    Physical Exam Vitals and nursing note reviewed. Exam conducted with a chaperone present.  Constitutional:      General: She is awake. She is not in acute distress.    Appearance: She is well-developed and well-groomed. She is obese. She is not ill-appearing or toxic-appearing.  HENT:     Head: Normocephalic.     Right Ear: Hearing and external ear normal.     Left Ear: Hearing and external ear normal.  Eyes:     General: Lids  are normal.        Right eye: No discharge.        Left eye: No discharge.     Conjunctiva/sclera: Conjunctivae normal.     Pupils: Pupils are equal, round, and reactive to light.  Neck:     Thyroid: No thyromegaly.     Vascular: No carotid bruit.  Cardiovascular:     Rate and Rhythm: Normal rate and regular rhythm.     Heart sounds: Normal heart sounds. No murmur heard.    No gallop.  Pulmonary:     Effort: Pulmonary effort is normal. No accessory muscle usage or respiratory distress.     Breath sounds: Normal breath sounds.  Abdominal:     General: Bowel sounds are normal. There is no distension.     Palpations: Abdomen is soft.     Tenderness: There is no abdominal tenderness.     Hernia: There is no hernia in the left inguinal area or right inguinal area.  Genitourinary:    Exam position: Lithotomy position.     Labia:        Right: No rash.        Left: No rash.      Urethra: No prolapse.     Vagina: Normal.     Cervix: Normal.     Uterus: Normal.      Adnexa: Right adnexa normal and left adnexa normal.     Comments: Cervix anterior and viewed. Pap obtained and sent to lab. Musculoskeletal:     Cervical back: Normal range of motion and neck supple.     Right lower leg: No edema.     Left lower leg: No edema.  Lymphadenopathy:     Cervical: No cervical adenopathy.  Skin:    General: Skin is warm and dry.  Neurological:     Mental Status: She is alert and oriented to person, place, and time.     Deep Tendon Reflexes: Reflexes are normal and symmetric.     Reflex Scores:      Brachioradialis reflexes are 2+ on the right side and 2+ on the left side.      Patellar reflexes are 2+ on the right side and 2+ on the left side. Psychiatric:        Attention and Perception: Attention normal.        Mood and Affect: Mood normal.        Speech: Speech normal.        Behavior: Behavior normal. Behavior is cooperative.        Thought Content: Thought content normal.      Results for orders placed or performed in visit on 12/14/23  CBC with Differential/Platelet   Collection  Time: 12/14/23 10:24 AM  Result Value Ref Range   WBC 6.4 3.4 - 10.8 x10E3/uL   RBC 4.98 3.77 - 5.28 x10E6/uL   Hemoglobin 11.7 11.1 - 15.9 g/dL   Hematocrit 61.4 65.9 - 46.6 %   MCV 77 (L) 79 - 97 fL   MCH 23.5 (L) 26.6 - 33.0 pg   MCHC 30.4 (L) 31.5 - 35.7 g/dL   RDW 83.5 (H) 88.2 - 84.5 %   Platelets 370 150 - 450 x10E3/uL   Neutrophils 59 Not Estab. %   Lymphs 34 Not Estab. %   Monocytes 5 Not Estab. %   Eos 2 Not Estab. %   Basos 0 Not Estab. %   Neutrophils Absolute 3.8 1.4 - 7.0 x10E3/uL   Lymphocytes Absolute 2.2 0.7 - 3.1 x10E3/uL   Monocytes Absolute 0.3 0.1 - 0.9 x10E3/uL   EOS (ABSOLUTE) 0.1 0.0 - 0.4 x10E3/uL   Basophils Absolute 0.0 0.0 - 0.2 x10E3/uL   Immature Granulocytes 0 Not Estab. %   Immature Grans (Abs) 0.0 0.0 - 0.1 x10E3/uL  Comprehensive metabolic panel with GFR   Collection Time: 12/14/23 10:24 AM  Result Value Ref Range   Glucose 82 70 - 99 mg/dL   BUN 20 6 - 20 mg/dL   Creatinine, Ser 9.28 0.57 - 1.00 mg/dL   eGFR 885 >40 fO/fpw/8.26   BUN/Creatinine Ratio 28 (H) 9 - 23   Sodium 139 134 - 144 mmol/L   Potassium 4.3 3.5 - 5.2 mmol/L   Chloride 105 96 - 106 mmol/L   CO2 21 20 - 29 mmol/L   Calcium 8.8 8.7 - 10.2 mg/dL   Total Protein 6.5 6.0 - 8.5 g/dL   Albumin 3.7 (L) 3.9 - 4.9 g/dL   Globulin, Total 2.8 1.5 - 4.5 g/dL   Bilirubin Total 0.2 0.0 - 1.2 mg/dL   Alkaline Phosphatase 80 44 - 121 IU/L   AST 15 0 - 40 IU/L   ALT 12 0 - 32 IU/L  TSH   Collection Time: 12/14/23 10:24 AM  Result Value Ref Range   TSH 2.490 0.450 - 4.500 uIU/mL  Lipid Panel w/o Chol/HDL Ratio   Collection Time: 12/14/23 10:24 AM  Result Value Ref Range   Cholesterol, Total 222 (H) 100 - 199 mg/dL   Triglycerides 75 0 - 149 mg/dL   HDL 47 >60 mg/dL   VLDL Cholesterol Cal 13 5 - 40 mg/dL   LDL Chol Calc (NIH) 837 (H) 0 - 99 mg/dL  Hepatitis C  antibody   Collection Time: 12/14/23 10:24 AM  Result Value Ref Range   Hep C Virus Ab Non Reactive Non Reactive  HIV Antibody (routine testing w rflx)   Collection Time: 12/14/23 10:24 AM  Result Value Ref Range   HIV Screen 4th Generation wRfx Non Reactive Non Reactive  HgB A1c   Collection Time: 12/14/23 10:24 AM  Result Value Ref Range   Hgb A1c MFr Bld 5.8 (H) 4.8 - 5.6 %   Est. average glucose Bld gHb Est-mCnc 120 mg/dL      Assessment & Plan:   Problem List Items Addressed This Visit       Other   Primary insomnia   Chronic for >3 years.  Headaches are improved when remembers Amitriptyline . Has not scheduled at home sleep study as of yet. ?some underlying OSA.  Discussed at length with her OSA and symptoms + testing.  Will discontinue Amitriptyline  in future if OSA present and able to get sleep  improved.  Educated on medication.  Monitor use with Lexapro .      Generalized anxiety disorder   Refer to depression plan of care.      Relevant Medications   escitalopram  (LEXAPRO ) 10 MG tablet   Depression, major, single episode, moderate (HCC) - Primary   Chronic, ongoing, some exacerbation due to life stressors.  Denies SI/HI.  Will increase Lexaprto 15 MG and continue Wellbutrin  XL 150 MG at this time.  Educated her on Wellbutrin  and side effects. Discussed that drugs of the SSRI class can have side effects such as weight gain, sexual dysfunction, insomnia, headache, nausea. BLACK BOX warning discussed.      Relevant Medications   escitalopram  (LEXAPRO ) 10 MG tablet   Other Visit Diagnoses       Cervical cancer screening       Pap performed and sent to lab.   Relevant Orders   Cytology - PAP        Follow up plan: Return in about 6 weeks (around 03/12/2024) for Depression, ANXIETY -- Lexapro  to 15 MG on 01/30/24.

## 2024-01-30 NOTE — Assessment & Plan Note (Signed)
 Chronic, ongoing, some exacerbation due to life stressors.  Denies SI/HI.  Will increase Lexaprto 15 MG and continue Wellbutrin  XL 150 MG at this time.  Educated her on Wellbutrin  and side effects. Discussed that drugs of the SSRI class can have side effects such as weight gain, sexual dysfunction, insomnia, headache, nausea. BLACK BOX warning discussed.

## 2024-01-30 NOTE — Assessment & Plan Note (Signed)
 Chronic for >3 years.  Headaches are improved when remembers Amitriptyline . Has not scheduled at home sleep study as of yet. ?some underlying OSA.  Discussed at length with her OSA and symptoms + testing.  Will discontinue Amitriptyline  in future if OSA present and able to get sleep improved.  Educated on medication.  Monitor use with Lexapro .

## 2024-01-30 NOTE — Assessment & Plan Note (Signed)
 Refer to depression plan of care.

## 2024-02-01 ENCOUNTER — Ambulatory Visit: Payer: Self-pay | Admitting: Nurse Practitioner

## 2024-02-01 LAB — CYTOLOGY - PAP
Comment: NEGATIVE
Diagnosis: NEGATIVE
High risk HPV: NEGATIVE

## 2024-02-01 NOTE — Progress Notes (Signed)
 Contacted via MyChart  Good news Megan Marsh, your pap returned and is overall negative on cytology and HPV.  You do not need to repeat for 5 years:) Wonderful!!

## 2024-02-04 ENCOUNTER — Other Ambulatory Visit: Payer: Self-pay | Admitting: Nurse Practitioner

## 2024-02-05 NOTE — Telephone Encounter (Signed)
 Filled with remaining refills from original order  Requested Prescriptions  Pending Prescriptions Disp Refills   buPROPion  (WELLBUTRIN  XL) 150 MG 24 hr tablet [Pharmacy Med Name: BUPROPION  HCL XL 150 MG TABLET] 90 tablet 0    Sig: TAKE 1 TABLET BY MOUTH EVERY DAY     Psychiatry: Antidepressants - bupropion  Passed - 02/05/2024  2:51 PM      Passed - Cr in normal range and within 360 days    Creatinine, Ser  Date Value Ref Range Status  12/14/2023 0.71 0.57 - 1.00 mg/dL Final         Passed - AST in normal range and within 360 days    AST  Date Value Ref Range Status  12/14/2023 15 0 - 40 IU/L Final         Passed - ALT in normal range and within 360 days    ALT  Date Value Ref Range Status  12/14/2023 12 0 - 32 IU/L Final         Passed - Completed PHQ-2 or PHQ-9 in the last 360 days      Passed - Last BP in normal range    BP Readings from Last 1 Encounters:  01/30/24 128/78         Passed - Valid encounter within last 6 months    Recent Outpatient Visits           6 days ago Depression, major, single episode, moderate (HCC)   [facility] [place], [place] T, NP   1 month ago Depression, major, single episode, moderate (HCC)   [facility] [place], [place] T, NP   3 months ago Primary insomnia   [facility] [place], Melanie DASEN, NP

## 2024-02-18 ENCOUNTER — Ambulatory Visit: Payer: Self-pay

## 2024-02-18 NOTE — Telephone Encounter (Signed)
 FYI Only or Action Required?: FYI only for provider.  Patient was last seen in primary care on 01/30/2024 by Cannady, Jolene T, NP.  Called Nurse Triage reporting Vaginal Bleeding and Flank Pain.  Symptoms began today for vaginal spotting and 2 weeks ago for flank pain.  Interventions attempted: Nothing.  Symptoms are: light pink vaginal spotting, bilateral intermittent flank pain stable.  Triage Disposition: See PCP When Office is Open (Within 3 Days)  Patient/caregiver understands and will follow disposition?: Yes         Copied from CRM #[id number]. Topic: Clinical - Red Word Triage >> Feb 18, 2024 10:27 AM Larissa RAMAN wrote: [facility] that prompted transfer to Nurse Triage: vaginal bleeding. Period was 2 weeks ago. Reason for Disposition  [1] Bleeding or spotting between regular periods AND [2] occurs three cycles (3 months) or less this past year  [1] MILD pain (i.e., scale 1-3; does not interfere with normal activities) AND [2] present > 3 days  Answer Assessment - Initial Assessment Questions 1. BLEEDING SEVERITY: Describe the bleeding that you are having. How much bleeding is there?      Light pink bleeding, a few drops.  2. ONSET: When did the bleeding begin? Is it continuing now?     15- 20 minutes ago, current.  3. MENSTRUAL PERIOD: When was the last normal menstrual period? How is this different than your period?     Last normal menstrual period was 2 weeks ago, she states she had 1 day that was heavy around day 4 but otherwise normal.  4. REGULARITY: How regular are your periods?     Pretty regular, she states sometimes it is all over the place but duration is always 7 days.  5. ABDOMEN PAIN: Do you have any pain? How bad is the pain?  (e.g., Scale 0-10; none, mild, moderate, or severe)     No.  6. PREGNANCY: Is there any chance you are pregnant? When was your last menstrual period?     No. LMP 2 weeks ago.  7. BREASTFEEDING: Are you  breastfeeding?     No.  8. HORMONE MEDICINES: Are you taking any hormone medicines, prescription or over-the-counter? (e.g., birth control pills, estrogen)     No.  9. BLOOD THINNER MEDICINES: Do you take any blood thinners? (e.g., Coumadin / warfarin, Pradaxa / dabigatran, aspirin)     No.  10. CAUSE: What do you think is causing the bleeding? (e.g., recent gyn surgery, recent gyn procedure; known bleeding disorder, cervical cancer, polycystic ovarian disease, fibroids)         She states she does not have a diagnosis but thinks she may have PCOS.  11. HEMODYNAMIC STATUS: Are you weak or feeling lightheaded? If Yes, ask: Can you stand and walk normally?        No weakness or lightheaded. She is able to stand and walk normally.  12. OTHER SYMPTOMS: What other symptoms are you having with the bleeding? (e.g., passed tissue, vaginal discharge, fever, menstrual-type cramps)       Vaginal pain 4/10, also states sometimes she has been feeling lower back pain and flank pain (has occurred twice over the past 2 weeks). Denies fever, passing tissue or blood clots, burning with urination, urinary frequency.  Protocols used: Vaginal Bleeding - Abnormal-A-AH, Flank Pain-A-AH

## 2024-02-20 ENCOUNTER — Ambulatory Visit: Admitting: Nurse Practitioner

## 2024-02-21 ENCOUNTER — Ambulatory Visit (INDEPENDENT_AMBULATORY_CARE_PROVIDER_SITE_OTHER): Admitting: Nurse Practitioner

## 2024-02-21 ENCOUNTER — Encounter: Payer: Self-pay | Admitting: Nurse Practitioner

## 2024-02-21 VITALS — BP 122/84 | HR 94 | Temp 98.1°F | Resp 15 | Ht 66.3 in | Wt 343.6 lb

## 2024-02-21 DIAGNOSIS — R8281 Pyuria: Secondary | ICD-10-CM

## 2024-02-21 DIAGNOSIS — N939 Abnormal uterine and vaginal bleeding, unspecified: Secondary | ICD-10-CM | POA: Diagnosis not present

## 2024-02-21 LAB — WET PREP FOR TRICH, YEAST, CLUE
Clue Cell Exam: NEGATIVE
Trichomonas Exam: NEGATIVE
Yeast Exam: NEGATIVE

## 2024-02-21 LAB — URINALYSIS, ROUTINE W REFLEX MICROSCOPIC
Bilirubin, UA: NEGATIVE
Glucose, UA: NEGATIVE
Ketones, UA: NEGATIVE
Nitrite, UA: NEGATIVE
Protein,UA: NEGATIVE
Specific Gravity, UA: 1.02 (ref 1.005–1.030)
Urobilinogen, Ur: 0.2 mg/dL (ref 0.2–1.0)
pH, UA: 5.5 (ref 5.0–7.5)

## 2024-02-21 LAB — MICROSCOPIC EXAMINATION: Bacteria, UA: NONE SEEN

## 2024-02-21 NOTE — Assessment & Plan Note (Signed)
 Acute x one episode on 02/18/24.  No further.  Is having some pressure, will check UA and wet prep today. Treat if needed.  At baseline has irregular cycles and recent pap was negative. No red flags on exam.  ?fibroids or PCOS.  If further episodes of bleeding between cycles or worsening then she is to reach out to PCP and will order vaginal ultrasound. Will defer internal today as was recently performed.

## 2024-02-21 NOTE — Patient Instructions (Signed)
 Abnormal Uterine Bleeding  Abnormal uterine bleeding is unusual bleeding from the uterus. It includes bleeding after sex, or bleeding or spotting between menstrual periods. It may also include bleeding that is heavier than normal, menstrual periods that last longer than usual, or bleeding that occurs after menopause. Abnormal uterine bleeding can affect teenagers, women in their reproductive years, pregnant women, and women who have reached menopause. Common causes of abnormal uterine bleeding include: Pregnancy. Abnormal growths within the lining of the uterus (polyps). Benign tumors or growths in the uterus (fibroids). These are not cancer. Infection. Cancer. Too much or too little of some hormones in the body (hormonal imbalances). Any type of abnormal bleeding should be checked by a health care provider. Many cases are minor and simple to treat, but others may be more serious. Treatment will depend on the cause of the bleeding and how severe it is. Follow these instructions at home: Medicines Take over-the-counter and prescription medicines only as told by your health care provider. Ask your health care provider about: Taking medicines such as aspirin and ibuprofen. These medicines can thin your blood. Do not take these medicines unless your health care provider tells you to take them. Taking over-the-counter medicines, vitamins, herbs, and supplements. If you were prescribed iron pills, take them as told by your health care provider. Iron pills help to replace iron that your body loses because of this condition. Managing constipation In cases of severe bleeding, you may be asked to increase your iron intake to treat anemia. Doing this may cause constipation. To prevent or treat constipation, you may need to: Drink enough fluid to keep your urine pale yellow. Take over-the-counter or prescription medicines. Eat foods that are high in fiber, such as beans, whole grains, and fresh fruits and  vegetables. Limit foods that are high in fat and processed sugars, such as fried or sweet foods. Activity Alter your activity to decrease bleeding if you need to change your sanitary pad more than one time every 2 hours: Lie in bed with your feet raised (elevated). Place a cold pack on your lower abdomen. Rest as much as possible until the bleeding stops or slows down. General instructions Do not use tampons, douche, or have sex until your health care provider says these things are okay. Change your sanitary pads often. Get regular exams. These include pelvic exams and cervical cancer screenings. It is up to you to get the results of any tests that are done. Ask your health care provider, or the department that is doing the tests, when your results will be ready. Monitor your condition for any changes. For 2 months, write down: When your menstrual period starts. When your menstrual period ends. When any abnormal vaginal bleeding occurs. What problems you notice. Keep all follow-up visits. This is important. Contact a health care provider if: You have bleeding that lasts for more than one week. You feel dizzy at times. You feel nauseous or you vomit. You feel light-headed or weak. You notice any other changes that show that your condition is getting worse. Get help right away if: You faint. You have bleeding that soaks through a sanitary pad every hour. You have pain in the abdomen. You have a fever or chills. You become sweaty or weak. You pass large blood clots from your vagina. These symptoms may represent a serious problem that is an emergency. Do not wait to see if the symptoms will go away. Get medical help right away. Call your local emergency services (  911 in the [place] Do not drive yourself to the hospital. Summary Abnormal uterine bleeding is unusual bleeding from the uterus. Any type of abnormal bleeding should be checked by a health care provider. Many cases are minor and  simple to treat, but others may be more serious. Treatment will depend on the cause of the bleeding and how severe it is. Get help right away if you faint, you have bleeding that soaks through a sanitary pad every hour, or you pass large blood clots from your vagina. This information is not intended to replace advice given to you by your health care provider. Make sure you discuss any questions you have with your health care provider. Document Revised: 12/25/2022 Document Reviewed: 09/21/2020 Elsevier Patient Education  2024 [employer].

## 2024-02-21 NOTE — Addendum Note (Signed)
 Addended by: Jakiya Bookbinder T on: 02/21/2024 11:59 AM   Modules accepted: Orders

## 2024-02-21 NOTE — Progress Notes (Signed)
 BP 122/84 (BP Location: Left Arm, Patient Position: Sitting, Cuff Size: Large)   Pulse 94   Temp 98.1 F (36.7 C) (Oral)   Resp 15   Ht 5' 6.3 (1.684 m)   Wt (!) 343 lb 9.6 oz (155.9 kg)   LMP 02/03/2024 (Exact Date)   SpO2 97%   BMI 54.96 kg/m    Subjective:    Patient ID: Megan Marsh, female    DOB: April 10, 1991, 33 y.o.   MRN: [id number]  HPI: Megan Marsh is a 33 y.o. female  Chief Complaint  Patient presents with   Vaginal Bleeding    Monday started bleeding but had period 2 weeks ago. Has has ongoing pain since but no other bleeding. No new partners.    ABNORMAL MENSTRUAL PERIODS Had bleeding episode on Monday (light), but had a cycle two weeks ago.  Since this time has had pain, bleeding was only on Monday.  Has one partner. Prior to bleeding did not have intercourse. Is not sure when her mother went through menopause. Having cramping discomfort today 2/10, pressure. At baseline she has more irregular cycles. Pap performed recently was negative. Duration: days Average interval between menses: 21 days or less Length of menses: 7 days Flow: moderate Dysmenorrhea: yes Intermenstrual bleeding:no Postcoital bleeding: no Contraception: none Menarche at age: 30th Grade Sexual activity: In a Monogamous Relationship History of sexually transmitted diseases: no History GYN procedures: no Abnormal pap smears: no   Dyspareunia: no Vaginal discharge:no Abdominal pain: yesterday cramping Galactorrhea: no Hirsuitism: no Frequent bruising/mucosal bleeding: no Double vision:no Hot flashes: been waking up in middle of the night very hot, a couple times in last 3 weeks  Relevant past medical, surgical, family and social history reviewed and updated as indicated. Interim medical history since our last visit reviewed. Allergies and medications reviewed and updated.  Review of Systems  Constitutional:  Negative for activity change, appetite change, diaphoresis, fatigue and  fever.  Respiratory:  Negative for cough, chest tightness, shortness of breath and wheezing.   Cardiovascular:  Negative for chest pain, palpitations and leg swelling.  Gastrointestinal:  Positive for abdominal pain (cramping and pressure).  Genitourinary:  Positive for vaginal bleeding (none since Monday). Negative for pelvic pain, vaginal discharge and vaginal pain.  Neurological: Negative.   Psychiatric/Behavioral: Negative.      Per HPI unless specifically indicated above     Objective:    BP 122/84 (BP Location: Left Arm, Patient Position: Sitting, Cuff Size: Large)   Pulse 94   Temp 98.1 F (36.7 C) (Oral)   Resp 15   Ht 5' 6.3 (1.684 m)   Wt (!) 343 lb 9.6 oz (155.9 kg)   LMP 02/03/2024 (Exact Date)   SpO2 97%   BMI 54.96 kg/m   Wt Readings from Last 3 Encounters:  02/21/24 (!) 343 lb 9.6 oz (155.9 kg)  01/30/24 (!) 343 lb 3.2 oz (155.7 kg)  12/14/23 (!) 342 lb 6.4 oz (155.3 kg)    Physical Exam Vitals and nursing note reviewed.  Constitutional:      General: She is awake. She is not in acute distress.    Appearance: She is well-developed and well-groomed. She is obese. She is not ill-appearing or toxic-appearing.  HENT:     Head: Normocephalic.     Right Ear: Hearing and external ear normal.     Left Ear: Hearing and external ear normal.  Eyes:     General: Lids are normal.  Right eye: No discharge.        Left eye: No discharge.     Conjunctiva/sclera: Conjunctivae normal.     Pupils: Pupils are equal, round, and reactive to light.  Neck:     Thyroid: No thyromegaly.     Vascular: No carotid bruit.  Cardiovascular:     Rate and Rhythm: Normal rate and regular rhythm.     Heart sounds: Normal heart sounds. No murmur heard.    No gallop.  Pulmonary:     Effort: Pulmonary effort is normal. No accessory muscle usage or respiratory distress.     Breath sounds: Normal breath sounds.  Abdominal:     General: Bowel sounds are normal. There is no  distension.     Palpations: Abdomen is soft.     Tenderness: There is no abdominal tenderness.  Musculoskeletal:     Cervical back: Normal range of motion and neck supple.     Right lower leg: No edema.     Left lower leg: No edema.  Lymphadenopathy:     Cervical: No cervical adenopathy.  Skin:    General: Skin is warm and dry.  Neurological:     Mental Status: She is alert and oriented to person, place, and time.     Deep Tendon Reflexes: Reflexes are normal and symmetric.     Reflex Scores:      Brachioradialis reflexes are 2+ on the right side and 2+ on the left side.      Patellar reflexes are 2+ on the right side and 2+ on the left side. Psychiatric:        Attention and Perception: Attention normal.        Mood and Affect: Mood normal.        Speech: Speech normal.        Behavior: Behavior normal. Behavior is cooperative.        Thought Content: Thought content normal.    Results for orders placed or performed in visit on 01/30/24  Cytology - PAP   Collection Time: 01/30/24  8:33 AM  Result Value Ref Range   High risk HPV Negative    Adequacy      Satisfactory for evaluation; transformation zone component PRESENT.   Diagnosis      - Negative for intraepithelial lesion or malignancy (NILM)   Comment Normal Reference Range HPV - Negative       Assessment & Plan:   Problem List Items Addressed This Visit       Other   Abnormal vaginal bleeding - Primary   Acute x one episode on 02/18/24.  No further.  Is having some pressure, will check UA and wet prep today. Treat if needed.  At baseline has irregular cycles and recent pap was negative. No red flags on exam.  ?fibroids or PCOS.  If further episodes of bleeding between cycles or worsening then she is to reach out to PCP and will order vaginal ultrasound. Will defer internal today as was recently performed.      Relevant Orders   Urinalysis, Routine w reflex microscopic   WET PREP FOR TRICH, YEAST, CLUE      Follow up plan: Return if symptoms worsen or fail to improve.

## 2024-02-22 ENCOUNTER — Ambulatory Visit: Payer: Self-pay | Admitting: Nurse Practitioner

## 2024-02-22 NOTE — Progress Notes (Signed)
 Contacted via [employer] prep negative and urine overall reassuring with just a trace blood and 1 + leukocytes, do not suspect infection present.  Any questions?

## 2024-02-23 LAB — URINE CULTURE: Organism ID, Bacteria: NO GROWTH

## 2024-03-05 ENCOUNTER — Encounter: Payer: Self-pay | Admitting: Nurse Practitioner

## 2024-05-08 ENCOUNTER — Other Ambulatory Visit: Payer: Self-pay | Admitting: Nurse Practitioner

## 2024-05-10 NOTE — Telephone Encounter (Signed)
 Requested Prescriptions  Pending Prescriptions Disp Refills   buPROPion  (WELLBUTRIN  XL) 150 MG 24 hr tablet [Pharmacy Med Name: BUPROPION  HCL XL 150 MG TABLET] 90 tablet 0    Sig: TAKE 1 TABLET BY MOUTH EVERY DAY     Psychiatry: Antidepressants - bupropion  Passed - 05/10/2024  9:23 PM      Passed - Cr in normal range and within 360 days    Creatinine, Ser  Date Value Ref Range Status  12/14/2023 0.71 0.57 - 1.00 mg/dL Final         Passed - AST in normal range and within 360 days    AST  Date Value Ref Range Status  12/14/2023 15 0 - 40 IU/L Final         Passed - ALT in normal range and within 360 days    ALT  Date Value Ref Range Status  12/14/2023 12 0 - 32 IU/L Final         Passed - Completed PHQ-2 or PHQ-9 in the last 360 days      Passed - Last BP in normal range    BP Readings from Last 1 Encounters:  02/21/24 122/84         Passed - Valid encounter within last 6 months    Recent Outpatient Visits           2 months ago Abnormal vaginal bleeding   [place] [facility] [place], Curran T, NP   3 months ago Depression, major, single episode, moderate (HCC)   [place] [facility] [place], Madras T, NP   4 months ago Depression, major, single episode, moderate (HCC)   [facility] [place], Melanie DASEN, NP   6 months ago Primary insomnia   [place] [facility] [place], Melanie DASEN, NP
